# Patient Record
Sex: Male | Born: 1955 | Race: White | Hispanic: No | Smoking: Never smoker
Health system: Southern US, Community
[De-identification: ages and names within clinical notes are randomized; demographics above are authoritative.]

## PROBLEM LIST (undated history)

## (undated) DIAGNOSIS — F329 Major depressive disorder, single episode, unspecified: Secondary | ICD-10-CM

## (undated) DIAGNOSIS — C9000 Multiple myeloma not having achieved remission: Secondary | ICD-10-CM

## (undated) DIAGNOSIS — D61818 Other pancytopenia: Secondary | ICD-10-CM

## (undated) DIAGNOSIS — K5790 Diverticulosis of intestine, part unspecified, without perforation or abscess without bleeding: Secondary | ICD-10-CM

## (undated) DIAGNOSIS — D509 Iron deficiency anemia, unspecified: Secondary | ICD-10-CM

## (undated) DIAGNOSIS — I420 Dilated cardiomyopathy: Secondary | ICD-10-CM

## (undated) DIAGNOSIS — I10 Essential (primary) hypertension: Secondary | ICD-10-CM

## (undated) DIAGNOSIS — F32A Depression, unspecified: Secondary | ICD-10-CM

## (undated) HISTORY — PX: INGUINAL HERNIA REPAIR: SUR1180

---

## 2009-10-21 ENCOUNTER — Other Ambulatory Visit: Admission: RE | Admit: 2009-10-21 | Discharge: 2009-10-21 | Payer: Self-pay | Admitting: Oncology

## 2014-07-29 ENCOUNTER — Other Ambulatory Visit (HOSPITAL_COMMUNITY)
Admission: RE | Admit: 2014-07-29 | Discharge: 2014-07-29 | Disposition: A | Discharge status: Home / Self Care | Payer: 59 | Source: Ambulatory Visit | Attending: Oncology | Admitting: Oncology

## 2014-07-29 DIAGNOSIS — D649 Anemia, unspecified: Secondary | ICD-10-CM | POA: Diagnosis not present

## 2014-07-29 LAB — BONE MARROW EXAM

## 2014-08-06 LAB — CHROMOSOME ANALYSIS, BONE MARROW

## 2014-08-06 LAB — TISSUE HYBRIDIZATION (BONE MARROW)-NCBH

## 2014-08-18 ENCOUNTER — Encounter (HOSPITAL_COMMUNITY): Payer: Self-pay

## 2016-08-01 DIAGNOSIS — E872 Acidosis: Secondary | ICD-10-CM

## 2016-08-01 DIAGNOSIS — E876 Hypokalemia: Secondary | ICD-10-CM

## 2016-08-01 DIAGNOSIS — C9000 Multiple myeloma not having achieved remission: Secondary | ICD-10-CM

## 2016-08-01 DIAGNOSIS — K625 Hemorrhage of anus and rectum: Secondary | ICD-10-CM

## 2016-08-01 DIAGNOSIS — D509 Iron deficiency anemia, unspecified: Secondary | ICD-10-CM | POA: Diagnosis not present

## 2016-08-01 DIAGNOSIS — D649 Anemia, unspecified: Secondary | ICD-10-CM | POA: Diagnosis not present

## 2016-08-02 DIAGNOSIS — D649 Anemia, unspecified: Secondary | ICD-10-CM | POA: Diagnosis not present

## 2016-08-02 DIAGNOSIS — D509 Iron deficiency anemia, unspecified: Secondary | ICD-10-CM | POA: Diagnosis not present

## 2016-08-02 DIAGNOSIS — E872 Acidosis: Secondary | ICD-10-CM | POA: Diagnosis not present

## 2016-08-02 DIAGNOSIS — K625 Hemorrhage of anus and rectum: Secondary | ICD-10-CM | POA: Diagnosis not present

## 2016-08-03 DIAGNOSIS — E872 Acidosis: Secondary | ICD-10-CM | POA: Diagnosis not present

## 2016-08-03 DIAGNOSIS — D509 Iron deficiency anemia, unspecified: Secondary | ICD-10-CM | POA: Diagnosis not present

## 2016-08-03 DIAGNOSIS — K625 Hemorrhage of anus and rectum: Secondary | ICD-10-CM | POA: Diagnosis not present

## 2016-08-03 DIAGNOSIS — D649 Anemia, unspecified: Secondary | ICD-10-CM | POA: Diagnosis not present

## 2016-08-04 DIAGNOSIS — D509 Iron deficiency anemia, unspecified: Secondary | ICD-10-CM | POA: Diagnosis not present

## 2016-08-04 DIAGNOSIS — D649 Anemia, unspecified: Secondary | ICD-10-CM | POA: Diagnosis not present

## 2016-08-04 DIAGNOSIS — E872 Acidosis: Secondary | ICD-10-CM | POA: Diagnosis not present

## 2016-08-04 DIAGNOSIS — K625 Hemorrhage of anus and rectum: Secondary | ICD-10-CM | POA: Diagnosis not present

## 2016-08-27 DIAGNOSIS — D72819 Decreased white blood cell count, unspecified: Secondary | ICD-10-CM

## 2016-08-27 DIAGNOSIS — D696 Thrombocytopenia, unspecified: Secondary | ICD-10-CM

## 2016-08-27 DIAGNOSIS — R791 Abnormal coagulation profile: Secondary | ICD-10-CM

## 2016-08-27 DIAGNOSIS — R601 Generalized edema: Secondary | ICD-10-CM

## 2016-08-27 DIAGNOSIS — C9000 Multiple myeloma not having achieved remission: Secondary | ICD-10-CM

## 2016-08-27 DIAGNOSIS — R779 Abnormality of plasma protein, unspecified: Secondary | ICD-10-CM

## 2016-08-27 DIAGNOSIS — D62 Acute posthemorrhagic anemia: Secondary | ICD-10-CM | POA: Diagnosis not present

## 2016-08-27 DIAGNOSIS — K625 Hemorrhage of anus and rectum: Secondary | ICD-10-CM

## 2016-08-28 DIAGNOSIS — C9000 Multiple myeloma not having achieved remission: Secondary | ICD-10-CM | POA: Diagnosis not present

## 2016-08-28 DIAGNOSIS — K625 Hemorrhage of anus and rectum: Secondary | ICD-10-CM | POA: Diagnosis not present

## 2016-08-28 DIAGNOSIS — D62 Acute posthemorrhagic anemia: Secondary | ICD-10-CM | POA: Diagnosis not present

## 2016-08-29 DIAGNOSIS — D62 Acute posthemorrhagic anemia: Secondary | ICD-10-CM | POA: Diagnosis not present

## 2016-08-29 DIAGNOSIS — C9000 Multiple myeloma not having achieved remission: Secondary | ICD-10-CM | POA: Diagnosis not present

## 2016-08-29 DIAGNOSIS — K625 Hemorrhage of anus and rectum: Secondary | ICD-10-CM | POA: Diagnosis not present

## 2016-08-30 DIAGNOSIS — K625 Hemorrhage of anus and rectum: Secondary | ICD-10-CM | POA: Diagnosis not present

## 2016-08-30 DIAGNOSIS — C9000 Multiple myeloma not having achieved remission: Secondary | ICD-10-CM | POA: Diagnosis not present

## 2016-08-30 DIAGNOSIS — D62 Acute posthemorrhagic anemia: Secondary | ICD-10-CM | POA: Diagnosis not present

## 2016-09-05 DIAGNOSIS — C9000 Multiple myeloma not having achieved remission: Secondary | ICD-10-CM | POA: Diagnosis not present

## 2016-10-04 DIAGNOSIS — N179 Acute kidney failure, unspecified: Secondary | ICD-10-CM

## 2016-10-04 DIAGNOSIS — D649 Anemia, unspecified: Secondary | ICD-10-CM

## 2016-10-04 DIAGNOSIS — C9000 Multiple myeloma not having achieved remission: Secondary | ICD-10-CM

## 2016-10-04 DIAGNOSIS — R0902 Hypoxemia: Secondary | ICD-10-CM | POA: Diagnosis not present

## 2016-10-04 DIAGNOSIS — I509 Heart failure, unspecified: Secondary | ICD-10-CM

## 2016-10-05 DIAGNOSIS — C9000 Multiple myeloma not having achieved remission: Secondary | ICD-10-CM

## 2016-10-05 DIAGNOSIS — D696 Thrombocytopenia, unspecified: Secondary | ICD-10-CM | POA: Diagnosis not present

## 2016-10-05 DIAGNOSIS — I5021 Acute systolic (congestive) heart failure: Secondary | ICD-10-CM

## 2016-10-05 DIAGNOSIS — N179 Acute kidney failure, unspecified: Secondary | ICD-10-CM | POA: Diagnosis not present

## 2016-10-06 ENCOUNTER — Inpatient Hospital Stay (HOSPITAL_COMMUNITY)
Admission: AD | Admit: 2016-10-06 | Discharge: 2016-10-20 | DRG: 291 | Disposition: A | Discharge status: Home / Self Care | Payer: BLUE CROSS/BLUE SHIELD | Source: Other Acute Inpatient Hospital | Attending: Internal Medicine | Admitting: Internal Medicine

## 2016-10-06 ENCOUNTER — Encounter (HOSPITAL_COMMUNITY): Payer: Self-pay | Admitting: Internal Medicine

## 2016-10-06 ENCOUNTER — Inpatient Hospital Stay (HOSPITAL_COMMUNITY): Payer: BLUE CROSS/BLUE SHIELD

## 2016-10-06 DIAGNOSIS — D696 Thrombocytopenia, unspecified: Secondary | ICD-10-CM

## 2016-10-06 DIAGNOSIS — E854 Organ-limited amyloidosis: Secondary | ICD-10-CM

## 2016-10-06 DIAGNOSIS — I5043 Acute on chronic combined systolic (congestive) and diastolic (congestive) heart failure: Secondary | ICD-10-CM | POA: Diagnosis present

## 2016-10-06 DIAGNOSIS — N170 Acute kidney failure with tubular necrosis: Secondary | ICD-10-CM | POA: Diagnosis not present

## 2016-10-06 DIAGNOSIS — I43 Cardiomyopathy in diseases classified elsewhere: Secondary | ICD-10-CM | POA: Diagnosis present

## 2016-10-06 DIAGNOSIS — Z888 Allergy status to other drugs, medicaments and biological substances status: Secondary | ICD-10-CM

## 2016-10-06 DIAGNOSIS — Z6841 Body Mass Index (BMI) 40.0 and over, adult: Secondary | ICD-10-CM | POA: Diagnosis not present

## 2016-10-06 DIAGNOSIS — I420 Dilated cardiomyopathy: Secondary | ICD-10-CM | POA: Diagnosis present

## 2016-10-06 DIAGNOSIS — E873 Alkalosis: Secondary | ICD-10-CM | POA: Diagnosis not present

## 2016-10-06 DIAGNOSIS — K5900 Constipation, unspecified: Secondary | ICD-10-CM | POA: Diagnosis not present

## 2016-10-06 DIAGNOSIS — R079 Chest pain, unspecified: Secondary | ICD-10-CM

## 2016-10-06 DIAGNOSIS — Z8249 Family history of ischemic heart disease and other diseases of the circulatory system: Secondary | ICD-10-CM

## 2016-10-06 DIAGNOSIS — D689 Coagulation defect, unspecified: Secondary | ICD-10-CM | POA: Diagnosis present

## 2016-10-06 DIAGNOSIS — Z91048 Other nonmedicinal substance allergy status: Secondary | ICD-10-CM

## 2016-10-06 DIAGNOSIS — D61810 Antineoplastic chemotherapy induced pancytopenia: Secondary | ICD-10-CM | POA: Diagnosis present

## 2016-10-06 DIAGNOSIS — D892 Hypergammaglobulinemia, unspecified: Secondary | ICD-10-CM | POA: Diagnosis present

## 2016-10-06 DIAGNOSIS — R0902 Hypoxemia: Secondary | ICD-10-CM

## 2016-10-06 DIAGNOSIS — E538 Deficiency of other specified B group vitamins: Secondary | ICD-10-CM

## 2016-10-06 DIAGNOSIS — N179 Acute kidney failure, unspecified: Secondary | ICD-10-CM | POA: Diagnosis present

## 2016-10-06 DIAGNOSIS — Z880 Allergy status to penicillin: Secondary | ICD-10-CM

## 2016-10-06 DIAGNOSIS — N183 Chronic kidney disease, stage 3 (moderate): Secondary | ICD-10-CM | POA: Diagnosis present

## 2016-10-06 DIAGNOSIS — I10 Essential (primary) hypertension: Secondary | ICD-10-CM | POA: Diagnosis not present

## 2016-10-06 DIAGNOSIS — I130 Hypertensive heart and chronic kidney disease with heart failure and stage 1 through stage 4 chronic kidney disease, or unspecified chronic kidney disease: Principal | ICD-10-CM | POA: Diagnosis present

## 2016-10-06 DIAGNOSIS — E875 Hyperkalemia: Secondary | ICD-10-CM | POA: Diagnosis present

## 2016-10-06 DIAGNOSIS — D61818 Other pancytopenia: Secondary | ICD-10-CM | POA: Diagnosis not present

## 2016-10-06 DIAGNOSIS — J069 Acute upper respiratory infection, unspecified: Secondary | ICD-10-CM

## 2016-10-06 DIAGNOSIS — D509 Iron deficiency anemia, unspecified: Secondary | ICD-10-CM | POA: Diagnosis present

## 2016-10-06 DIAGNOSIS — Z8701 Personal history of pneumonia (recurrent): Secondary | ICD-10-CM

## 2016-10-06 DIAGNOSIS — J9601 Acute respiratory failure with hypoxia: Secondary | ICD-10-CM | POA: Diagnosis present

## 2016-10-06 DIAGNOSIS — E871 Hypo-osmolality and hyponatremia: Secondary | ICD-10-CM | POA: Diagnosis present

## 2016-10-06 DIAGNOSIS — I5082 Biventricular heart failure: Secondary | ICD-10-CM | POA: Diagnosis present

## 2016-10-06 DIAGNOSIS — I509 Heart failure, unspecified: Secondary | ICD-10-CM

## 2016-10-06 DIAGNOSIS — N390 Urinary tract infection, site not specified: Secondary | ICD-10-CM | POA: Diagnosis present

## 2016-10-06 DIAGNOSIS — Z515 Encounter for palliative care: Secondary | ICD-10-CM | POA: Diagnosis not present

## 2016-10-06 DIAGNOSIS — D500 Iron deficiency anemia secondary to blood loss (chronic): Secondary | ICD-10-CM | POA: Diagnosis present

## 2016-10-06 DIAGNOSIS — D649 Anemia, unspecified: Secondary | ICD-10-CM

## 2016-10-06 DIAGNOSIS — R601 Generalized edema: Secondary | ICD-10-CM | POA: Diagnosis not present

## 2016-10-06 DIAGNOSIS — Z66 Do not resuscitate: Secondary | ICD-10-CM | POA: Diagnosis present

## 2016-10-06 DIAGNOSIS — Z7189 Other specified counseling: Secondary | ICD-10-CM

## 2016-10-06 DIAGNOSIS — D638 Anemia in other chronic diseases classified elsewhere: Secondary | ICD-10-CM | POA: Diagnosis present

## 2016-10-06 DIAGNOSIS — I5023 Acute on chronic systolic (congestive) heart failure: Secondary | ICD-10-CM | POA: Diagnosis not present

## 2016-10-06 DIAGNOSIS — C9000 Multiple myeloma not having achieved remission: Secondary | ICD-10-CM | POA: Diagnosis present

## 2016-10-06 DIAGNOSIS — R188 Other ascites: Secondary | ICD-10-CM | POA: Diagnosis not present

## 2016-10-06 DIAGNOSIS — T451X5A Adverse effect of antineoplastic and immunosuppressive drugs, initial encounter: Secondary | ICD-10-CM | POA: Diagnosis present

## 2016-10-06 DIAGNOSIS — E860 Dehydration: Secondary | ICD-10-CM

## 2016-10-06 DIAGNOSIS — M549 Dorsalgia, unspecified: Secondary | ICD-10-CM | POA: Diagnosis present

## 2016-10-06 DIAGNOSIS — D699 Hemorrhagic condition, unspecified: Secondary | ICD-10-CM

## 2016-10-06 DIAGNOSIS — Z79899 Other long term (current) drug therapy: Secondary | ICD-10-CM

## 2016-10-06 HISTORY — DX: Major depressive disorder, single episode, unspecified: F32.9

## 2016-10-06 HISTORY — DX: Other pancytopenia: D61.818

## 2016-10-06 HISTORY — DX: Depression, unspecified: F32.A

## 2016-10-06 HISTORY — DX: Essential (primary) hypertension: I10

## 2016-10-06 HISTORY — DX: Iron deficiency anemia, unspecified: D50.9

## 2016-10-06 HISTORY — DX: Dilated cardiomyopathy: I42.0

## 2016-10-06 HISTORY — DX: Multiple myeloma not having achieved remission: C90.00

## 2016-10-06 HISTORY — DX: Diverticulosis of intestine, part unspecified, without perforation or abscess without bleeding: K57.90

## 2016-10-06 LAB — CBC WITH DIFFERENTIAL/PLATELET
BASOS ABS: 0 10*3/uL (ref 0.0–0.1)
Basophils Relative: 0 %
EOS ABS: 0 10*3/uL (ref 0.0–0.7)
Eosinophils Relative: 1 %
HEMATOCRIT: 22.7 % — AB (ref 39.0–52.0)
Hemoglobin: 7.1 g/dL — ABNORMAL LOW (ref 13.0–17.0)
LYMPHS ABS: 0.7 10*3/uL (ref 0.7–4.0)
LYMPHS PCT: 18 %
MCH: 30 pg (ref 26.0–34.0)
MCHC: 31.3 g/dL (ref 30.0–36.0)
MCV: 95.8 fL (ref 78.0–100.0)
MONO ABS: 0.6 10*3/uL (ref 0.1–1.0)
Monocytes Relative: 15 %
NEUTROS ABS: 2.5 10*3/uL (ref 1.7–7.7)
Neutrophils Relative %: 66 %
PLATELETS: 69 10*3/uL — AB (ref 150–400)
RBC: 2.37 MIL/uL — ABNORMAL LOW (ref 4.22–5.81)
RDW: 21.3 % — ABNORMAL HIGH (ref 11.5–15.5)
WBC: 3.8 10*3/uL — ABNORMAL LOW (ref 4.0–10.5)

## 2016-10-06 LAB — URINALYSIS, COMPLETE (UACMP) WITH MICROSCOPIC
BILIRUBIN URINE: NEGATIVE
Glucose, UA: 50 mg/dL — AB
Ketones, ur: NEGATIVE mg/dL
LEUKOCYTES UA: NEGATIVE
NITRITE: NEGATIVE
PH: 6 (ref 5.0–8.0)
Protein, ur: 100 mg/dL — AB
SPECIFIC GRAVITY, URINE: 1.012 (ref 1.005–1.030)

## 2016-10-06 LAB — APTT: APTT: 70 s — AB (ref 24–36)

## 2016-10-06 LAB — COMPREHENSIVE METABOLIC PANEL
ALBUMIN: 1.4 g/dL — AB (ref 3.5–5.0)
ALT: 14 U/L — ABNORMAL LOW (ref 17–63)
AST: 19 U/L (ref 15–41)
Alkaline Phosphatase: 85 U/L (ref 38–126)
Anion gap: 13 (ref 5–15)
BUN: 34 mg/dL — AB (ref 6–20)
CHLORIDE: 91 mmol/L — AB (ref 101–111)
CO2: 23 mmol/L (ref 22–32)
Calcium: 7.7 mg/dL — ABNORMAL LOW (ref 8.9–10.3)
Creatinine, Ser: 3.28 mg/dL — ABNORMAL HIGH (ref 0.61–1.24)
GFR calc Af Amer: 22 mL/min — ABNORMAL LOW (ref >60)
GFR calc non Af Amer: 19 mL/min — ABNORMAL LOW (ref >60)
GLUCOSE: 116 mg/dL — AB (ref 65–99)
POTASSIUM: 5.8 mmol/L — AB (ref 3.5–5.1)
SODIUM: 127 mmol/L — AB (ref 135–145)
Total Bilirubin: 1.2 mg/dL (ref 0.3–1.2)
Total Protein: 11.9 g/dL — ABNORMAL HIGH (ref 6.5–8.1)

## 2016-10-06 LAB — PHOSPHORUS: Phosphorus: 4.9 mg/dL — ABNORMAL HIGH (ref 2.5–4.6)

## 2016-10-06 LAB — PROTIME-INR
INR: 1.97
Prothrombin Time: 22.7 seconds — ABNORMAL HIGH (ref 11.4–15.2)

## 2016-10-06 LAB — BRAIN NATRIURETIC PEPTIDE: B NATRIURETIC PEPTIDE 5: 1535.9 pg/mL — AB (ref 0.0–100.0)

## 2016-10-06 LAB — MAGNESIUM: Magnesium: 2.2 mg/dL (ref 1.7–2.4)

## 2016-10-06 LAB — GLUCOSE, CAPILLARY: Glucose-Capillary: 97 mg/dL (ref 65–99)

## 2016-10-06 LAB — TROPONIN I: Troponin I: <0.03 ng/mL (ref <0.03)

## 2016-10-06 MED ORDER — IPRATROPIUM-ALBUTEROL 0.5-2.5 (3) MG/3ML IN SOLN: 3.0000 mL | RESPIRATORY_TRACT | Status: DC | PRN

## 2016-10-06 MED ORDER — ONDANSETRON HCL 4 MG PO TABS
4.0000 mg | ORAL_TABLET | Freq: Four times a day (QID) | ORAL | Status: DC | PRN
  Administered 2016-10-13: 4 mg via ORAL
  Filled 2016-10-06: qty 1

## 2016-10-06 MED ORDER — POTASSIUM CHLORIDE CRYS ER 10 MEQ PO TBCR
10.0000 meq | EXTENDED_RELEASE_TABLET | Freq: Two times a day (BID) | ORAL | Status: DC
  Administered 2016-10-06: 10 meq via ORAL
  Filled 2016-10-06: qty 1

## 2016-10-06 MED ORDER — SODIUM CHLORIDE 0.9% FLUSH
3.0000 mL | Freq: Two times a day (BID) | INTRAVENOUS | Status: DC
  Administered 2016-10-06 – 2016-10-09 (×5): 3 mL via INTRAVENOUS

## 2016-10-06 MED ORDER — ONDANSETRON HCL 4 MG/2ML IJ SOLN
4.0000 mg | Freq: Four times a day (QID) | INTRAMUSCULAR | Status: DC | PRN
  Administered 2016-10-14 – 2016-10-19 (×3): 4 mg via INTRAVENOUS
  Filled 2016-10-06 (×3): qty 2

## 2016-10-06 MED ORDER — FUROSEMIDE 10 MG/ML IJ SOLN
160.0000 mg | Freq: Three times a day (TID) | INTRAVENOUS | Status: DC
  Administered 2016-10-06: 160 mg via INTRAVENOUS
  Filled 2016-10-06 (×2): qty 16

## 2016-10-06 MED ORDER — ACETAMINOPHEN 325 MG PO TABS
650.0000 mg | ORAL_TABLET | Freq: Four times a day (QID) | ORAL | Status: DC | PRN
  Administered 2016-10-07 – 2016-10-16 (×9): 650 mg via ORAL
  Filled 2016-10-06 (×12): qty 2

## 2016-10-06 MED ORDER — ACETAMINOPHEN 650 MG RE SUPP: 650.0000 mg | Freq: Four times a day (QID) | RECTAL | Status: DC | PRN

## 2016-10-06 NOTE — H&P (Signed)
History and Physical    Lance [name] DGL:[id number] DOB: Jan 26, 1956 DOA: 10/06/2016  PCP: Physicians are in [place].  Patient coming from: [facility]  Chief Complaint: Progressive renal insufficiency while diuresing for CHF.  HPI: Lance [name] is a 61 y.o. gentleman with a history of transfusion dependent multiple myeloma, systolic heart failure with EF 35-40% in January 2018, pancytopenia, and HTN who was admitted to [facility] on 10/04/2016 for management of CHF exacerbation.  The patient has required several transfusions in the past 30 days.  He presented with shortness of breath and anasarca.  His home dose of lasix has been 81m daily (he tells me that his dose was going to be increased to 858mpo BID due to his swelling, but that had not happened [place]  He was treated with IV lasix 4073mID.  He put out 1.5L of fluid, but his creatinine has been rising.  Today, BUN 30, creatinine 2.9, potassium 6.1, normal bicarb.  His baseline creatinine is 1.  He received kayexalate for the elevated potassium level prior to transfer.  Transfer requested for nephrology consultation.  [place] [place] [address].  He had a CT of the abdomen and pelvis without contrast that was negative for hydronephrosis and kidney stones..  Moderate ascites noted.  Chest xray showed cardiomegaly, mild CHF, and small bilateral effusions.  Lower extremity dopplers were negative for DVT.  At time of admission here, he reports intermittent back pain that radiates anteriorly to his midchest.  He has had some light-headeness, but no LOC.  He also reports difficulty urinating due to scrotal swelling.  Review of Systems: As per HPI otherwise 10 point review of systems negative.    Past Medical History:  Diagnosis Date  . Depression   . Diverticulosis   . Hypertension   . [place] [facility]   Past Surgical History:  Procedure Laterality  Date  . INGUINAL HERNIA REPAIR Right      reports that he has never smoked. He has never used smokeless tobacco. He reports that he does not drink alcohol or use drugs.  Allergies  Allergen Reactions  . Penicillins     Unknown Has patient had a PCN reaction causing immediate rash, facial/tongue/throat swelling, SOB or lightheadedness with hypotension: YES Has patient had a PCN reaction causing severe rash involving mucus membranes or skin necrosis: NO Has patient had a PCN reaction that required hospitalization NO Has patient had a PCN reaction occurring within the last 10 years: NO If all of the above answers are "NO", then may proceed with Cephalosporin use.  . Tobacco [Nicotiana Tabacum]     Family History  Problem Relation Age of Onset  . Hypertension Mother   . Diabetes Mother   . Bone cancer Mother   . Diabetes Father   . Prostate cancer Father   . Lung cancer Sister   . Brain cancer Brother      Prior to Admission medications   Medication Sig Start Date End Date Taking? Authorizing Provider  B Complex-C-Folic Acid (RENO CAPS) 1 MG CAPS Take 1 capsule by mouth daily. 08/11/16  Yes Historical Provider, MD  ferrous gluconate (FERGON) 324 MG tablet Take 1 tablet by mouth daily. 08/10/16  Yes Historical Provider, MD  furosemide (LASIX) 40 MG tablet Take 80 mg by mouth daily. 09/29/16  Yes Historical Provider, MD  lisinopril (PRINIVIL,ZESTRIL) 2.5 MG tablet Take 2.5 mg by mouth daily.  09/26/16 10/26/16 Yes Historical Provider, MD  potassium chloride (K-DUR,KLOR-CON) 10 MEQ tablet Take 1 tablet by mouth 2 (two) times daily. 09/26/16 09/26/17 Yes Historical Provider, MD  PROAIR HFA 108 (90 Base) MCG/ACT inhaler Take 1 puff by mouth daily as needed. 07/19/16  Yes Historical Provider, MD    Physical Exam: Vitals:   10/06/16 2014  BP: 120/67  Pulse: 84  Resp: (!) 24  Temp: 97.8 F (36.6 C)  TempSrc: Oral  SpO2: 99%  Weight: (!) 143.6 kg (316 lb 9.3 oz)      Constitutional:  NAD, appears short of breath, speaking in 4-5 word sentences Vitals:   10/06/16 2014  BP: 120/67  Pulse: 84  Resp: (!) 24  Temp: 97.8 F (36.6 C)  TempSrc: Oral  SpO2: 99%  Weight: (!) 143.6 kg (316 lb 9.3 oz)   Eyes: PERRL, lids and conjunctivae normal ENMT: Mucous membranes are moist. Posterior pharynx clear of any exudate or lesions. Normal dentition.  Neck: normal appearance, supple, no masses Respiratory: Diminished bilaterally but no wheezing.  Mildly tachypneic.  No accessory muscle use.  Cardiovascular: Normal rate, regular rhythm, no murmurs / rubs / gallops. 2+ pitting edema in bilateral lower extremities.  2+ pedal pulses. GI: abdomen is protuberant and distended, mildly compressible but no rigid.  No guarding.  No tenderness.  No rebound.   Bowel sounds are hypoactive. Musculoskeletal:  No joint deformity in upper and lower extremities. Good ROM, no contractures. Normal muscle tone.  Skin: pale, cool, dry.  Rashes to both shins, petechial with stasis changes as well. Neurologic: CN 2-12 grossly intact. Sensation intact, Strength symmetric bilaterally. Psychiatric: Normal judgment and insight. Alert and oriented x 3. Normal mood.     Labs on Admission: I have personally reviewed following labs and imaging studies  CBC:  Recent Labs Lab 10/06/16 2101  WBC 3.8*  NEUTROABS PENDING  HGB 7.1*  HCT 22.7*  MCV 95.8  PLT PENDING   Basic Metabolic Panel:  Recent Labs Lab 10/06/16 2101  NA 127*  K 5.8*  CL 91*  CO2 23  GLUCOSE 116*  BUN 34*  CREATININE 3.28*  CALCIUM 7.7*  MG 2.2  PHOS 4.9*   GFR: CrCl cannot be calculated (Unknown ideal weight.). Liver Function Tests:  Recent Labs Lab 10/06/16 2101  AST 19  ALT 14*  ALKPHOS 85  BILITOT 1.2  PROT 11.9*  ALBUMIN 1.4*   Coagulation Profile:  Recent Labs Lab 10/06/16 2101  INR 1.97   Cardiac Enzymes:  Recent Labs Lab 10/06/16 2101  TROPONINI <0.03   CBG:  Recent Labs Lab  10/06/16 2012  GLUCAP 97   Urine analysis:    Component Value Date/Time   COLORURINE RED (A) 10/06/2016 2210   APPEARANCEUR CLOUDY (A) 10/06/2016 2210   LABSPEC 1.012 10/06/2016 2210   PHURINE 6.0 10/06/2016 2210   GLUCOSEU 50 (A) 10/06/2016 2210   HGBUR MODERATE (A) 10/06/2016 2210   BILIRUBINUR NEGATIVE 10/06/2016 2210   KETONESUR NEGATIVE 10/06/2016 2210   PROTEINUR 100 (A) 10/06/2016 2210   NITRITE NEGATIVE 10/06/2016 2210   LEUKOCYTESUR NEGATIVE 10/06/2016 2210   Radiological Exams on Admission: Portable Chest 1 View  Result Date: 10/06/2016 CLINICAL DATA:  Chest pain, history of pneumonia and wheezing. EXAM: PORTABLE CHEST 1 VIEW COMPARISON:  10/04/2016 FINDINGS: There is stable cardiomegaly. No aortic aneurysm. Mild vascular congestion consistent with CHF. Small bilateral pleural effusions with trace fluid in the right minor fissure. Subsegmental atelectasis in the left mid lung versus scarring. Bibasilar  densities cannot exclude superimposed pneumonia. No acute osseous abnormality. IMPRESSION: Stable cardiomegaly with mild CHF. Superimposed bibasilar pneumonia would be difficult to entirely exclude. Trace bilateral pleural effusions. Electronically Signed   By: Ashley Royalty M.D.   On: 10/06/2016 22:09    EKG: Pending.  Assessment/Plan Principal Problem:   CHF (congestive heart failure) (HCC) Active Problems:   AKI (acute kidney injury) ([place])   HTN (hypertension)   Anasarca      Acute exacerbation of chronic systolic heart failure with AKI in the setting of diuresis. --Case discussed with Dr. Florene Glen of nephrology, who will see the patient in consultation --Lasix 165m IV q8h initially ordered per discussion with nephrology.  The patient has received one dose.  However, admission labs here are worsening compared to labs [place] [facility] [place], so I held additional doses until AM labs can be reviewed. --Check Urinalysis --Strict I/O --Daily weights --1200 fluid  restriction  Atypical chest pain; I suspect this is related to volume overload. --Telemetry monitoring --EKG pending --Serial troponin  History of HTN --ACE-I held due to worsening renal function --Monitor BP  Hyperkalemia, persistent [place]--Will give another dose of kayexalate 30g po once --IV insulin and one amp of D50 now --AM labs pending  Elevated phos level, hypocalcemia --Will start calcium acetate (phoslo) 6671mTID with meals    DVT prophylaxis: SCDs Code Status: FULL Family Communication: Patient alone at time of admission. Disposition Plan: Expect he will go home at discharge. Consults called: Nephrology (Dr. PoFlorene GlenAdmission status: Inpatient telemetry   TIME SPENT: 70 minutes   CaEber JonesD [facility] Pager [phone] 7PM-7AM, please contact night-coverage [email or web address] Password [email or web address]  10/06/2016, 10:05 PM

## 2016-10-07 ENCOUNTER — Inpatient Hospital Stay (HOSPITAL_COMMUNITY): Payer: BLUE CROSS/BLUE SHIELD

## 2016-10-07 DIAGNOSIS — J9601 Acute respiratory failure with hypoxia: Secondary | ICD-10-CM | POA: Diagnosis present

## 2016-10-07 DIAGNOSIS — R079 Chest pain, unspecified: Secondary | ICD-10-CM

## 2016-10-07 DIAGNOSIS — T451X5A Adverse effect of antineoplastic and immunosuppressive drugs, initial encounter: Secondary | ICD-10-CM

## 2016-10-07 DIAGNOSIS — I509 Heart failure, unspecified: Secondary | ICD-10-CM

## 2016-10-07 DIAGNOSIS — C9000 Multiple myeloma not having achieved remission: Secondary | ICD-10-CM | POA: Diagnosis present

## 2016-10-07 DIAGNOSIS — I10 Essential (primary) hypertension: Secondary | ICD-10-CM

## 2016-10-07 DIAGNOSIS — D61810 Antineoplastic chemotherapy induced pancytopenia: Secondary | ICD-10-CM | POA: Diagnosis present

## 2016-10-07 DIAGNOSIS — E875 Hyperkalemia: Secondary | ICD-10-CM | POA: Diagnosis present

## 2016-10-07 LAB — CBC WITH DIFFERENTIAL/PLATELET
BASOS ABS: 0 10*3/uL (ref 0.0–0.1)
Basophils Relative: 0 %
EOS PCT: 1 %
Eosinophils Absolute: 0 10*3/uL (ref 0.0–0.7)
HCT: 23.4 % — ABNORMAL LOW (ref 39.0–52.0)
Hemoglobin: 7.5 g/dL — ABNORMAL LOW (ref 13.0–17.0)
Lymphocytes Relative: 19 %
Lymphs Abs: 0.8 10*3/uL (ref 0.7–4.0)
MCH: 30.5 pg (ref 26.0–34.0)
MCHC: 32.1 g/dL (ref 30.0–36.0)
MCV: 95.1 fL (ref 78.0–100.0)
MONO ABS: 0.3 10*3/uL (ref 0.1–1.0)
Monocytes Relative: 8 %
NEUTROS PCT: 72 %
Neutro Abs: 2.9 10*3/uL (ref 1.7–7.7)
PLATELETS: 71 10*3/uL — AB (ref 150–400)
RBC: 2.46 MIL/uL — AB (ref 4.22–5.81)
RDW: 21.3 % — ABNORMAL HIGH (ref 11.5–15.5)
WBC: 4 10*3/uL (ref 4.0–10.5)

## 2016-10-07 LAB — MAGNESIUM: Magnesium: 2.3 mg/dL (ref 1.7–2.4)

## 2016-10-07 LAB — RENAL FUNCTION PANEL
Albumin: 1.4 g/dL — ABNORMAL LOW (ref 3.5–5.0)
Anion gap: 10 (ref 5–15)
BUN: 36 mg/dL — ABNORMAL HIGH (ref 6–20)
CHLORIDE: 96 mmol/L — AB (ref 101–111)
CO2: 23 mmol/L (ref 22–32)
CREATININE: 3.28 mg/dL — AB (ref 0.61–1.24)
Calcium: 7.4 mg/dL — ABNORMAL LOW (ref 8.9–10.3)
GFR calc Af Amer: 22 mL/min — ABNORMAL LOW (ref >60)
GFR, EST NON AFRICAN AMERICAN: 19 mL/min — AB (ref >60)
Glucose, Bld: 124 mg/dL — ABNORMAL HIGH (ref 65–99)
Phosphorus: 4.9 mg/dL — ABNORMAL HIGH (ref 2.5–4.6)
Potassium: 5.2 mmol/L — ABNORMAL HIGH (ref 3.5–5.1)
Sodium: 129 mmol/L — ABNORMAL LOW (ref 135–145)

## 2016-10-07 LAB — TROPONIN I

## 2016-10-07 LAB — ECHOCARDIOGRAM COMPLETE
Height: 70 in
Weight: 5065.29 oz

## 2016-10-07 LAB — GLUCOSE, CAPILLARY: Glucose-Capillary: 83 mg/dL (ref 65–99)

## 2016-10-07 MED ORDER — CALCIUM ACETATE (PHOS BINDER) 667 MG PO CAPS
667.0000 mg | ORAL_CAPSULE | Freq: Three times a day (TID) | ORAL | Status: DC
  Administered 2016-10-07 – 2016-10-20 (×37): 667 mg via ORAL
  Filled 2016-10-07 (×38): qty 1

## 2016-10-07 MED ORDER — NEPRO/CARBSTEADY PO LIQD
237.0000 mL | Freq: Three times a day (TID) | ORAL | Status: DC | PRN
  Filled 2016-10-07: qty 237

## 2016-10-07 MED ORDER — FUROSEMIDE 10 MG/ML IJ SOLN
160.0000 mg | Freq: Three times a day (TID) | INTRAVENOUS | Status: DC
  Administered 2016-10-07 (×2): 160 mg via INTRAVENOUS
  Filled 2016-10-07 (×4): qty 16

## 2016-10-07 MED ORDER — CALCIUM CARBONATE ANTACID 1250 MG/5ML PO SUSP
500.0000 mg | Freq: Four times a day (QID) | ORAL | Status: DC | PRN
  Administered 2016-10-12: 500 mg via ORAL
  Filled 2016-10-07 (×4): qty 5

## 2016-10-07 MED ORDER — METOLAZONE 5 MG PO TABS
10.0000 mg | ORAL_TABLET | Freq: Two times a day (BID) | ORAL | Status: DC
  Administered 2016-10-08 – 2016-10-14 (×13): 10 mg via ORAL
  Filled 2016-10-07: qty 1
  Filled 2016-10-07: qty 2
  Filled 2016-10-07 (×2): qty 1
  Filled 2016-10-07 (×9): qty 2
  Filled 2016-10-07: qty 1

## 2016-10-07 MED ORDER — HYDROXYZINE HCL 25 MG PO TABS: 25.0000 mg | ORAL_TABLET | Freq: Three times a day (TID) | ORAL | Status: DC | PRN

## 2016-10-07 MED ORDER — WHITE PETROLATUM GEL
Status: AC
  Administered 2016-10-07: 02:00:00
  Filled 2016-10-07: qty 1

## 2016-10-07 MED ORDER — PERFLUTREN LIPID MICROSPHERE
1.0000 mL | INTRAVENOUS | Status: AC | PRN
  Administered 2016-10-07: 2 mL via INTRAVENOUS
  Filled 2016-10-07: qty 10

## 2016-10-07 MED ORDER — DOCUSATE SODIUM 283 MG RE ENEM
1.0000 | ENEMA | RECTAL | Status: DC | PRN
  Filled 2016-10-07: qty 1

## 2016-10-07 MED ORDER — DEXTROSE 50 % IV SOLN
1.0000 | Freq: Once | INTRAVENOUS | Status: AC
  Administered 2016-10-07: 50 mL via INTRAVENOUS
  Filled 2016-10-07: qty 50

## 2016-10-07 MED ORDER — SORBITOL 70 % SOLN
30.0000 mL | Status: DC | PRN
  Administered 2016-10-15 (×2): 30 mL via ORAL
  Filled 2016-10-07 (×2): qty 30

## 2016-10-07 MED ORDER — FUROSEMIDE 10 MG/ML IJ SOLN
160.0000 mg | Freq: Four times a day (QID) | INTRAVENOUS | Status: DC
  Administered 2016-10-07 – 2016-10-16 (×33): 160 mg via INTRAVENOUS
  Filled 2016-10-07 (×38): qty 16

## 2016-10-07 MED ORDER — ZOLPIDEM TARTRATE 5 MG PO TABS: 5.0000 mg | ORAL_TABLET | Freq: Every evening | ORAL | Status: DC | PRN

## 2016-10-07 MED ORDER — INSULIN ASPART 100 UNIT/ML IV SOLN
10.0000 [IU] | Freq: Once | INTRAVENOUS | Status: AC
  Administered 2016-10-07: 10 [IU] via INTRAVENOUS

## 2016-10-07 MED ORDER — CAMPHOR-MENTHOL 0.5-0.5 % EX LOTN
1.0000 "application " | TOPICAL_LOTION | Freq: Three times a day (TID) | CUTANEOUS | Status: DC | PRN
  Filled 2016-10-07: qty 222

## 2016-10-07 MED ORDER — ORAL CARE MOUTH RINSE
15.0000 mL | Freq: Two times a day (BID) | OROMUCOSAL | Status: DC
  Administered 2016-10-07 – 2016-10-19 (×17): 15 mL via OROMUCOSAL

## 2016-10-07 MED ORDER — SODIUM POLYSTYRENE SULFONATE 15 GM/60ML PO SUSP
30.0000 g | Freq: Once | ORAL | Status: AC
  Administered 2016-10-07: 30 g via ORAL
  Filled 2016-10-07: qty 120

## 2016-10-07 MED ORDER — METOLAZONE 5 MG PO TABS
5.0000 mg | ORAL_TABLET | Freq: Every day | ORAL | Status: DC
  Administered 2016-10-07: 5 mg via ORAL
  Filled 2016-10-07: qty 1

## 2016-10-07 NOTE — Progress Notes (Signed)
  Echocardiogram 2D Echocardiogram has been performed.  Jennette Dubin 10/07/2016, 12:32 PM

## 2016-10-07 NOTE — Progress Notes (Signed)
New Admission Note:  Arrival Method: EMS Stretcher Mental Orientation: Alert and oriented x 4 Telemetry: Box 01 NSR  Assessment: Completed Skin: Warm and dry  IV: NSL  Pain: 4/10 Tubes: Foley  Safety Measures: Safety Fall Prevention Plan was given, discussed and signed. Admission: Completed [address] [place] Orientation: Patient has been orientated to the room, unit and the staff. Family: None   Orders have been reviewed and implemented. Will continue to monitor the patient. Call light has been placed within reach and bed alarm has been activated.   Sima Matas BSN, RN  Phone Number: [phone]

## 2016-10-07 NOTE — Progress Notes (Addendum)
PROGRESS NOTE    Lance [name]  XHB:[id number] DOB: [birth year] DOA: 10/06/2016 PCP: No primary care provider on file.    Brief Narrative:  Lance [name] is a 61 y.o. gentleman with a history of transfusion dependent multiple myeloma, systolic heart failure with EF 35-40% in January 2018, pancytopenia, and HTN who was admitted to [facility] on 10/04/2016 for management of CHF exacerbation.  The patient has required several transfusions in the past 30 days.  He presented with shortness of breath and anasarca.  His home dose of lasix has been 20m daily (he tells me that his dose was going to be increased to 888mpo BID due to his swelling, but that had not happened [place] to [place]  He was treated with IV lasix 4026mID.  He put out 1.5L of fluid, but his creatinine has been rising.  Today, BUN 30, creatinine 2.9, potassium 6.1, normal bicarb.  His baseline creatinine is 1.  He received kayexalate for the elevated potassium level prior to transfer.  Transfer requested for nephrology consultation.  [place] [facility] [place] [facility].  He had a CT of the abdomen and pelvis without contrast that was negative for hydronephrosis and kidney stones..  Moderate ascites noted.  Chest xray showed cardiomegaly, mild CHF, and small bilateral effusions.  Lower extremity dopplers were negative for DVT.  At time of admission here, he reports intermittent back pain that radiates anteriorly to his midchest.  He has had some light-headeness, but no LOC.  He also reports difficulty urinating due to scrotal swelling.    Assessment & Plan:   Principal Problem:   Acute respiratory failure with hypoxia (HCC) Active Problems:   Acute on chronic systolic CHF (congestive heart failure) (HCC)   ARF (acute renal failure) (HCC)   HTN (hypertension)   Anasarca   Hyperkalemia   Hyperphosphatemia   Hypocalcemia   Anemia   Multiple myeloma (HCC)   Chest pain   #1 acute respiratory  failure with hypoxia secondary to acute on chronic systolic heart failure/cardiomyopathy Secondary to acute on chronic systolic heart failure. Patient presented from outside hospital after presentation with heart failure and worsening renal function. Patient current weight on admission is 143.6 kg. Patient's weight on 09/26/2016 was 132 kg at his cardiologist's office. Cardiac enzymes negative 3. BNP elevated at 1535.9. It is noted per patient's cardiologist note that patient did have an EF of 35-40% which was a recently new discovery. Repeat 2-D echo pending. Patient with a urine output of 500 mL since admission. Continue Lasix 160 mg IV every 8 hours. Will add metalazone 5 mg daily. Nephrology consultation pending. If patient with no significant diuresis and worsening renal function may need temporary hemodialysis. Strict I's and O's. Daily weights.  #2 acute renal failure Questionable etiology. Likely secondary to acute systolic heart failure in the setting of ACE inhibitor. Per care everywhere patient's creatinine was 1.16 on 09/26/2016 and then subsequently 1.87 on 10/02/2016. Patient recently started on lisinopril on 1/30/ 2018 per his cardiologist. Urinalysis on admission with 100 protein, nitrite negative, leukocytes negative, glucose of 50. Will check a fractional excretion of sodium. Check a renal ultrasound. Patient on high-dose Lasix 160 mg IV every 8 hours. Strict I's and O's. Daily weights. If worsening renal function and poor urine output with no significant diuresis may likely need hemodialysis. Nephrology consultation pending.  #3 multiple myeloma Patient being followed by oncology in the outpatient setting and has recently required transfusions secondary to severe  anemia. Patient noted to have a history of iron deficiency anemia. Follow H&H. Transfusion threshold hemoglobin less than 7.  #4 anemia Likely secondary to problem #3. Check an anemia panel. Follow H&H. Chest seizure threshold  hemoglobin less than 7.  #5 anasarca See problem #1.  #6 hypertension Blood pressure currently stable. Continue IV Lasix. ACE inhibitor on hold secondary to acute renal failure.  #7 hyperkalemia Secondary to acute renal failure. Patient received a dose of Kayexalate on admission as well as IV insulin in the amp of D50. Repeat potassium currently at 5.2. Patient on IV Lasix. Monitor for now.  #8 hypocalcemia/elevated phosphorus level Continue current dose of PhosLo.  #9 chest pain Likely secondary to volume overload. Patient on telemetry. EKG with no ischemic changes noted. Cardiac enzymes negative to date. 2-D echo pending. Continue diuresis with high-dose IV Lasix. Follow.  #10 hyponatremia Secondary to volume overload. Follow with diuresis.   DVT prophylaxis: scd Code Status: Full Family Communication: Updated patient and wife at bedside. Disposition Plan: Home once volume overload has resolved and patient no longer hypoxic, acute renal failure has resolved and per nephrology.   Consultants:   Nephrology pending  Procedures:   Chest x-ray 10/06/2016  2-D echo  10/07/2016  Antimicrobials:   None   Subjective: Patient states no change in shortness of breath. Patient denies any chest pain. No nausea. No vomiting. Patient uncomfortable. Patient wants to sit up.  Objective: Vitals:   10/06/16 2014 10/07/16 0555 10/07/16 0915 10/07/16 0944  BP: 120/67 128/74 (!) 122/56   Pulse: 84 82 79   Resp: (!) 24 (!) 22 20   Temp: 97.8 F (36.6 C) 97.4 F (36.3 C) 97.9 F (36.6 C)   TempSrc: Oral Oral Oral   SpO2: 99% 97% 97%   Weight: (!) 143.6 kg (316 lb 9.3 oz)     Height:    _0  (1.778 m)    Intake/Output Summary (Last 24 hours) at 10/07/16 1540 Last data filed at 10/07/16 1010  Gross per 24 hour  Intake              852 ml  Output              500 ml  Net              352 ml   Filed Weights   10/06/16 2014  Weight: (!) 143.6 kg (316 lb 9.3 oz)     Examination:  General exam: Somewhat uncomfortable. Respiratory system: Bibasilar crackles. Respiratory effort normal. Cardiovascular system: S1 & S2 heard, RRR. + JVD, murmurs, rubs, gallops or clicks. 2-3+ bilateral lower extremity edema.  Gastrointestinal system: Abdomen is distended, tight and nontender. No organomegaly or masses felt. Normal bowel sounds heard. Central nervous system: Alert and oriented. No focal neurological deficits. Extremities: Symmetric 5 x 5 power.2- 3+ bilateral lower extremity edema. Skin: Erythema bilateral anterior lower extremities on the shins.  Psychiatry: Judgement and insight appear normal. Mood & affect appropriate.     Data Reviewed: I have personally reviewed following labs and imaging studies  CBC:  Recent Labs Lab 10/06/16 2101 10/07/16 1300  WBC 3.8* 4.0  NEUTROABS 2.5 2.9  HGB 7.1* 7.5*  HCT 22.7* 23.4*  MCV 95.8 95.1  PLT 69* 71*   Basic Metabolic Panel:  Recent Labs Lab 10/06/16 2101 10/07/16 1300  NA 127* 129*  K 5.8* 5.2*  CL 91* 96*  CO2 23 23  GLUCOSE 116* 124*  BUN 34* 36*  CREATININE  3.28* 3.28*  CALCIUM 7.7* 7.4*  MG 2.2 2.3  PHOS 4.9* 4.9*   GFR: Estimated Creatinine Clearance: 33.4 mL/min (by C-G formula based on SCr of 3.28 mg/dL (H)). Liver Function Tests:  Recent Labs Lab 10/06/16 2101 10/07/16 1300  AST 19  --   ALT 14*  --   ALKPHOS 85  --   BILITOT 1.2  --   PROT 11.9*  --   ALBUMIN 1.4* 1.4*   No results for input(s): LIPASE, AMYLASE in the last 168 hours. No results for input(s): AMMONIA in the last 168 hours. Coagulation Profile:  Recent Labs Lab 10/06/16 2101  INR 1.97   Cardiac Enzymes:  Recent Labs Lab 10/06/16 2101 10/07/16 0455 10/07/16 1300  TROPONINI <0.03 <0.03 <0.03   BNP (last 3 results) No results for input(s): PROBNP in the last 8760 hours. HbA1C: No results for input(s): HGBA1C in the last 72 hours. CBG:  Recent Labs Lab 10/06/16 2012 10/07/16 0809   GLUCAP 97 83   Lipid Profile: No results for input(s): CHOL, HDL, LDLCALC, TRIG, CHOLHDL, LDLDIRECT in the last 72 hours. Thyroid Function Tests: No results for input(s): TSH, T4TOTAL, FREET4, T3FREE, THYROIDAB in the last 72 hours. Anemia Panel: No results for input(s): VITAMINB12, FOLATE, FERRITIN, TIBC, IRON, RETICCTPCT in the last 72 hours. Sepsis Labs: No results for input(s): PROCALCITON, LATICACIDVEN in the last 168 hours.  No results found for this or any previous visit (from the past 240 hour(s)).       Radiology Studies: Portable Chest 1 View  Result Date: 10/06/2016 CLINICAL DATA:  Chest pain, history of pneumonia and wheezing. EXAM: PORTABLE CHEST 1 VIEW COMPARISON:  10/04/2016 FINDINGS: There is stable cardiomegaly. No aortic aneurysm. Mild vascular congestion consistent with CHF. Small bilateral pleural effusions with trace fluid in the right minor fissure. Subsegmental atelectasis in the left mid lung versus scarring. Bibasilar densities cannot exclude superimposed pneumonia. No acute osseous abnormality. IMPRESSION: Stable cardiomegaly with mild CHF. Superimposed bibasilar pneumonia would be difficult to entirely exclude. Trace bilateral pleural effusions. Electronically Signed   By: Ashley Royalty M.D.   On: 10/06/2016 22:09        Scheduled Meds: . calcium acetate  667 mg Oral TID WC  . furosemide  160 mg Intravenous Q8H  . mouth rinse  15 mL Mouth Rinse BID  . sodium chloride flush  3 mL Intravenous Q12H   Continuous Infusions:   LOS: 1 day    Time spent: 36 minutes    Remmi Armenteros, MD [facility] Pager [phone]  If 7PM-7AM, please contact night-coverage [email or web address] Password [email or web address] 10/07/2016, 3:40 PM

## 2016-10-07 NOTE — Consult Note (Addendum)
Renal Service Consult Note [facility]  Lance [name] 10/07/2016 Lance [name] D Requesting Physician:  Lance [name]  Reason for Consult:  Renal failure HPI: The patient is a 61 y.o. year-old with history of chronic CHF (EF 35-40%), HTN, multiple myeloma, pancytopenia who presented to [place] on 2/7 with SOB and CHF exacerbation.  Was anemic and required transfusions as well.  He rec'd IV lasix and had good UOP but creatinine rose up to 2.9.  K was high and he rec'd Kayexalate.  Transfer was requested for neph services.    Pt had CT abdomen at [facility] which was negative for hydronephrosis and kidney stones.  Moderate ascites was noted. CXR here showing mild CHF.  LE dopplers have been done and neg for DVT.    BP's here are 120- 130/ 55- 75.    No old chart/ no past admissions.  History per patient is that he was diagnosed with multiple myeloma "about 2 yrs ago", but he didn't take chemo at the time as his parents had both died and he was depressed, per the patient. He then did eventually start the chemo and this was about 4 weeks ago in [place] with Lance [name].   Main c/o is swelling of abdomen, abd wall, scrotum and LE's.  Also DOE and orthopnea.  No prod cough , no fevers, no abd pain, +diarrhea, no N/V.  Has foley in here.   He is single, never married, no children.  Has one sister living, here with him today.  Grew up in [place] , went to [employer], no college. 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  Trying to get on disability now.  LIves in a house in [place].  No tob Lance [name].      HOme meds > Rena Caps, Fergon, Lasix 80/d, lisinopril 2.5 mg /day, KCL 10 bid, Proair HFA    Past Medical History  Past Medical History:  Diagnosis Date  . Depression   . Diverticulosis   . Hypertension   . Multiple myeloma ([place])   . Pancytopenia [facility])    Past Surgical History  Past Surgical History:  Procedure Laterality Date   . INGUINAL HERNIA REPAIR Right    Family History  Family History  Problem Relation Age of Onset  . Hypertension Mother   . Diabetes Mother   . Bone cancer Mother   . Diabetes Father   . Prostate cancer Father   . Lung cancer Sister   . Brain cancer Brother    Social History  reports that he has never smoked. He has never used smokeless tobacco. He reports that he does not drink alcohol or use drugs. Allergies  Allergies  Allergen Reactions  . Penicillins     Unknown Has patient had a PCN reaction causing immediate rash, facial/tongue/throat swelling, SOB or lightheadedness with hypotension: YES Has patient had a PCN reaction causing severe rash involving mucus membranes or skin necrosis: NO Has patient had a PCN reaction that required hospitalization NO Has patient had a PCN reaction occurring within the last 10 years: NO If all of the above answers are "NO", then may proceed with Cephalosporin use.  . Tobacco [Nicotiana Tabacum]    Home medications Prior to Admission medications   Medication Sig Start Date End Date Taking? Authorizing Provider  B Complex-C-Folic Acid (RENO CAPS) 1 MG CAPS Take 1 capsule by mouth daily. 08/11/16  Yes Historical Provider, MD  ferrous gluconate (FERGON) 324 MG tablet  Take 1 tablet by mouth daily. 08/10/16  Yes Historical Provider, MD  furosemide (LASIX) 40 MG tablet Take 80 mg by mouth daily. 09/29/16  Yes Historical Provider, MD  lisinopril (PRINIVIL,ZESTRIL) 2.5 MG tablet Take 2.5 mg by mouth daily. 09/26/16 10/26/16 Yes Historical Provider, MD  potassium chloride (K-DUR,KLOR-CON) 10 MEQ tablet Take 1 tablet by mouth 2 (two) times daily. 09/26/16 09/26/17 Yes Historical Provider, MD  PROAIR HFA 108 (90 Base) MCG/ACT inhaler Take 1 puff by mouth daily as needed. 07/19/16  Yes Historical Provider, MD   Liver Function Tests  Recent Labs Lab 10/06/16 2101 10/07/16 1300  AST 19  --   ALT 14*  --   ALKPHOS 85  --   BILITOT 1.2  --   PROT 11.9*  --    ALBUMIN 1.4* 1.4*   No results for input(s): LIPASE, AMYLASE in the last 168 hours. CBC  Recent Labs Lab 10/06/16 2101 10/07/16 1300  WBC 3.8* 4.0  NEUTROABS 2.5 2.9  HGB 7.1* 7.5*  HCT 22.7* 23.4*  MCV 95.8 95.1  PLT 69* 71*   Basic Metabolic Panel  Recent Labs Lab 10/06/16 2101 10/07/16 1300  NA 127* 129*  K 5.8* 5.2*  CL 91* 96*  CO2 23 23  GLUCOSE 116* 124*  BUN 34* 36*  CREATININE 3.28* 3.28*  CALCIUM 7.7* 7.4*  PHOS 4.9* 4.9*   Iron/TIBC/Ferritin/ %Sat No results found for: IRON, TIBC, FERRITIN, IRONPCTSAT  Vitals:   10/07/16 0555 10/07/16 0915 10/07/16 0944 10/07/16 1703  BP: 128/74 (!) 122/56  (!) 124/59  Pulse: 82 79  82  Resp: (!) 22 20  20   Temp: 97.4 F (36.3 C) 97.9 F (36.6 C)  98.8 F (37.1 C)  TempSrc: Oral Oral  Oral  SpO2: 97% 97%  97%  Weight:      Height:   5' 10"  (1.778 m)    Exam Gen large, morbidly obese, uncomfortable and mildly SOB at rest No rash, cyanosis or gangrene Sclera anicteric, throat clear  ++JVD to angle of jaw Chest soft bibasilar rales, o/w clear RRR w 2/6 SEM, no gallop Abd marked obesity and marked ascitic distension, also marked abd wall edema and scrotal edema 3+ GU small penis, foley in place MS no joint effusions or deformity Ext 2-3+ bilat LE edema lower legs, 1-2+ thigh edema bilat / no wounds or ulcers Neuro is alert, Ox 3 , nf, no asterixis  ECHO > LVEF 35%, severe RV dysfunction, PA peak 18m Hg CXR > stable CM with mild CHF/ fluid in fissure Na 129  K 5.2  CO2 23  BUN 36  Cr 3.28  eGFR 22  Alb1.4 WBC 4k  Hb 7.5  plt 71 UA > cloudy, many bact, tntc rbc/ wbc, 100 prot, pH 6, color red INR 1.97  Assessment: 1.  Renal failure - unknown baseline, no old records here.  UA dirty w UTI.  Could have GN as well.  Most likely has myeloma kidney complicated by cardiorenal syndrome.  Massive volume overload. AGree with highest dose IV lasix + zaroxlyn.  IF this doesn't work would consider IV inotropes.   Dialysis is another option of course, but no strong indication yet for this now.   2.  CM EF 35% 3.  Morbid obestiy 4.  Mult myeloma - just started chemo 4 wks ago, was diagnosed however 2 yrs ago approx per pt 5.  HTN - holding acei appropriately, bp's ok at 120/80 6.  Pyuria - r/o UTI, send  culture    Plan - as above  Kelly Splinter MD [facility] pager [phone]   10/07/2016, 7:31 PM

## 2016-10-08 ENCOUNTER — Encounter (HOSPITAL_COMMUNITY): Payer: Self-pay | Admitting: Cardiology

## 2016-10-08 DIAGNOSIS — I5023 Acute on chronic systolic (congestive) heart failure: Secondary | ICD-10-CM

## 2016-10-08 DIAGNOSIS — R601 Generalized edema: Secondary | ICD-10-CM

## 2016-10-08 DIAGNOSIS — N179 Acute kidney failure, unspecified: Secondary | ICD-10-CM

## 2016-10-08 LAB — CBC WITH DIFFERENTIAL/PLATELET
BASOS ABS: 0 10*3/uL (ref 0.0–0.1)
Basophils Relative: 0 %
EOS ABS: 0 10*3/uL (ref 0.0–0.7)
Eosinophils Relative: 1 %
HEMATOCRIT: 23.7 % — AB (ref 39.0–52.0)
Hemoglobin: 7.6 g/dL — ABNORMAL LOW (ref 13.0–17.0)
LYMPHS ABS: 0.7 10*3/uL (ref 0.7–4.0)
Lymphocytes Relative: 17 %
MCH: 30 pg (ref 26.0–34.0)
MCHC: 32.1 g/dL (ref 30.0–36.0)
MCV: 93.7 fL (ref 78.0–100.0)
MONO ABS: 0.5 10*3/uL (ref 0.1–1.0)
MONOS PCT: 11 %
NEUTROS ABS: 3.1 10*3/uL (ref 1.7–7.7)
Neutrophils Relative %: 71 %
PLATELETS: 82 10*3/uL — AB (ref 150–400)
RBC: 2.53 MIL/uL — AB (ref 4.22–5.81)
RDW: 21.1 % — AB (ref 11.5–15.5)
WBC: 4.3 10*3/uL (ref 4.0–10.5)

## 2016-10-08 LAB — BASIC METABOLIC PANEL
Anion gap: 11 (ref 5–15)
BUN: 42 mg/dL — AB (ref 6–20)
CALCIUM: 7.6 mg/dL — AB (ref 8.9–10.3)
CHLORIDE: 95 mmol/L — AB (ref 101–111)
CO2: 23 mmol/L (ref 22–32)
CREATININE: 3.61 mg/dL — AB (ref 0.61–1.24)
GFR calc non Af Amer: 17 mL/min — ABNORMAL LOW (ref >60)
GFR, EST AFRICAN AMERICAN: 19 mL/min — AB (ref >60)
Glucose, Bld: 111 mg/dL — ABNORMAL HIGH (ref 65–99)
Potassium: 5 mmol/L (ref 3.5–5.1)
SODIUM: 129 mmol/L — AB (ref 135–145)

## 2016-10-08 LAB — RENAL FUNCTION PANEL
ALBUMIN: 1.4 g/dL — AB (ref 3.5–5.0)
Anion gap: 13 (ref 5–15)
BUN: 40 mg/dL — AB (ref 6–20)
CALCIUM: 7.7 mg/dL — AB (ref 8.9–10.3)
CO2: 23 mmol/L (ref 22–32)
CREATININE: 3.58 mg/dL — AB (ref 0.61–1.24)
Chloride: 92 mmol/L — ABNORMAL LOW (ref 101–111)
GFR calc Af Amer: 20 mL/min — ABNORMAL LOW (ref >60)
GFR, EST NON AFRICAN AMERICAN: 17 mL/min — AB (ref >60)
GLUCOSE: 102 mg/dL — AB (ref 65–99)
PHOSPHORUS: 5.3 mg/dL — AB (ref 2.5–4.6)
POTASSIUM: 5.5 mmol/L — AB (ref 3.5–5.1)
SODIUM: 128 mmol/L — AB (ref 135–145)

## 2016-10-08 LAB — MAGNESIUM: Magnesium: 2.3 mg/dL (ref 1.7–2.4)

## 2016-10-08 MED ORDER — SODIUM POLYSTYRENE SULFONATE 15 GM/60ML PO SUSP
45.0000 g | Freq: Once | ORAL | Status: AC
Start: 2016-10-08 — End: 2016-10-08
  Administered 2016-10-08: 45 g via ORAL
  Filled 2016-10-08: qty 180

## 2016-10-08 NOTE — Progress Notes (Signed)
PROGRESS NOTE    Lance [name]  HKV:[id number] DOB: March 14, 1956 DOA: 10/06/2016 PCP: No primary care provider on file.    Brief Narrative:  Lance [name] is a 61 y.o. gentleman with a history of transfusion dependent multiple myeloma, systolic heart failure with EF 35-40% in January 2018, pancytopenia, and HTN who was admitted to [facility] on 10/04/2016 for management of CHF exacerbation.  The patient has required several transfusions in the past 30 days.  He presented with shortness of breath and anasarca.  His home dose of lasix has been 10m daily (he tells me that his dose was going to be increased to 812mpo BID due to his swelling, but that had not happened prior to his admission to [facility]  He was treated with IV lasix 4015mID.  He put out 1.5L of fluid, but his creatinine has been rising.  Today, BUN 30, creatinine 2.9, potassium 6.1, normal bicarb.  His baseline creatinine is 1.  He received kayexalate for the elevated potassium level prior to transfer.  Transfer requested for nephrology consultation.  [facility].  He had a CT of the abdomen and pelvis without contrast that was negative for hydronephrosis and kidney stones..  Moderate ascites noted.  Chest xray showed cardiomegaly, mild CHF, and small bilateral effusions.  Lower extremity dopplers were negative for DVT.  At time of admission here, he reports intermittent back pain that radiates anteriorly to his midchest.  He has had some light-headeness, but no LOC.  He also reports difficulty urinating due to scrotal swelling.    Assessment & Plan:   Principal Problem:   Acute respiratory failure with hypoxia (HCC) Active Problems:   Acute on chronic systolic CHF (congestive heart failure) (HCC)   ARF (acute renal failure) (HCC)   HTN (hypertension)   Anasarca   Hyperkalemia   Hyperphosphatemia   Hypocalcemia   Anemia   Multiple myeloma (HCC)   Chest pain   #1 acute respiratory  failure with hypoxia secondary to acute on chronic systolic heart failure/cardiomyopathy Secondary to acute on chronic systolic heart failure. Patient presented from outside hospital after presentation with heart failure and worsening renal function. Patient current weight on admission is 143.6 kg. Patient's weight on 09/26/2016 was 132 kg at his cardiologist's office. Cardiac enzymes negative 3. BNP elevated at 1535.9. It is noted per patient's cardiologist note that patient did have an EF of 35-40% which was a recently new discovery. Repeat 2-D echo with a EF of 35%, diffuse hypokinesis moderately dilated right ventricle with severely reduced contraction, elevated CVP. Patient with a urine output of 1600 mL over the past 24 hours and patient is -1.1 L during this hospitalization. Weights pending for today. Patient on high-dose Lasix 160 mg IV every 8 hours and Zaroxolyn 10 mg twice a day. Patient with no significant output has still significantly volume overloaded. Patient has been seen in consultation by nephrology who recommended possible inotrpoes. If patient with no significant diuresis and worsening renal function may need temporary hemodialysis. Strict I's and O's. Daily weights. Consult with cardiology for further evaluation and recommendations.  #2 acute renal failure Questionable etiology. Likely secondary to multiple myeloma and acute systolic heart failure in the setting of ACE inhibitor. Per care everywhere patient's creatinine was 1.16 on 09/26/2016 and then subsequently 1.87 on 10/02/2016. Patient recently started on lisinopril on 1/30/ 2018 per his cardiologist. Urinalysis on admission with 100 protein, nitrite negative, leukocytes negative, glucose of 50. Renal ultrasound with  no signs of hydronephrosis, ascites. No significant change in renal function. Patient on high-dose Lasix 160 mg IV every 8 hours and Zaroxolyn. Urine output of 1600 mL over the past 24 hours. Strict I's and O's. Daily  weights. If worsening renal function and poor urine output with no significant diuresis may likely need hemodialysis. Nephrology following.   #3 multiple myeloma Patient being followed by oncology in the outpatient setting and has recently required transfusions secondary to severe anemia. Patient noted to have a history of iron deficiency anemia. Follow H&H. Transfusion threshold hemoglobin less than 7.  #4 anemia Likely secondary to problem #3. Check an anemia panel. Follow H&H. Transfusion threshold hemoglobin less than 7.  #5 anasarca See problem #1.  #6 hypertension Blood pressure currently stable. Continue IV Lasix. ACE inhibitor on hold secondary to acute renal failure.  #7 hyperkalemia Secondary to acute renal failure. Patient received a dose of Kayexalate on admission as well as IV insulin in the amp of D50. Repeat potassium currently at 5. 5. Patient on IV Lasix. Kayexalate 45 g by mouth 1. Monitor for now.  #8 hypocalcemia/elevated phosphorus level Continue current dose of PhosLo.  #9 chest pain Likely secondary to volume overload. Patient on telemetry. EKG with no ischemic changes noted. Cardiac enzymes negative to date. 2-D echo with EF of 35%, mild LVH, diffuse hypokinesis with no significant change from 2-D echo noted on patient's cardiologist note on recent visit of 09/26/2016. Patient on high-dose IV Lasix as well as Zaroxolyn with minimal urine output. Continue diuresis with high-dose IV Lasix and Zaroxolyn. Follow.  #10 hyponatremia Secondary to volume overload. Follow with diuresis.   DVT prophylaxis: scd Code Status: Full Family Communication: Updated patient and wife at bedside. Disposition Plan: Home once volume overload has resolved and patient no longer hypoxic, acute renal failure has resolved and per nephrology.   Consultants:   Nephrology Dr Jonnie Finner: 10/07/2016  Cardiology pending  Procedures:   Chest x-ray 10/06/2016  2-D echo   10/07/2016  Renal ultrasound 10/07/2016  Antimicrobials:   None   Subjective: Patient states no significant change in shortness of breath. Patient denies any chest pain. No nausea. No vomiting. Patient sitting up at the side of the bed.   Objective: Vitals:   10/07/16 1703 10/07/16 2248 10/08/16 0630 10/08/16 0940  BP: (!) 124/59 (!) 113/44 117/61 123/60  Pulse: 82 82 80 82  Resp: 20 18 20 18   Temp: 98.8 F (37.1 C) 97.9 F (36.6 C) 97.4 F (36.3 C) 97.5 F (36.4 C)  TempSrc: Oral Oral Oral Oral  SpO2: 97% 96% 100% 100%  Weight:  (!) 142.7 kg (314 lb 8 oz)    Height:  5' 10"  (1.778 m)      Intake/Output Summary (Last 24 hours) at 10/08/16 1312 Last data filed at 10/08/16 0940  Gross per 24 hour  Intake              546 ml  Output             1600 ml  Net            -1054 ml   Filed Weights   10/06/16 2014 10/07/16 2248  Weight: (!) 143.6 kg (316 lb 9.3 oz) (!) 142.7 kg (314 lb 8 oz)    Examination:  General exam: Somewhat uncomfortable. Respiratory system: Bibasilar crackles. Respiratory effort normal. Cardiovascular system: S1 & S2 heard, RRR. + JVD, murmurs, rubs, gallops or clicks. 2-3+ bilateral lower extremity edema.  Gastrointestinal  system: Abdomen is distended, tight and nontender. No organomegaly or masses felt. Normal bowel sounds heard. Central nervous system: Alert and oriented. No focal neurological deficits. Extremities: Symmetric 5 x 5 power.2- 3+ bilateral lower extremity edema. Skin: Erythema bilateral anterior lower extremities on the shins.  Psychiatry: Judgement and insight appear normal. Mood & affect appropriate.     Data Reviewed: I have personally reviewed following labs and imaging studies  CBC:  Recent Labs Lab 10/06/16 2101 10/07/16 1300 10/08/16 0147  WBC 3.8* 4.0 4.3  NEUTROABS 2.5 2.9 3.1  HGB 7.1* 7.5* 7.6*  HCT 22.7* 23.4* 23.7*  MCV 95.8 95.1 93.7  PLT 69* 71* 82*   Basic Metabolic Panel:  Recent Labs Lab  10/06/16 2101 10/07/16 1300 10/08/16 0147 10/08/16 1212  NA 127* 129* 128* 129*  K 5.8* 5.2* 5.5* 5.0  CL 91* 96* 92* 95*  CO2 23 23 23 23   GLUCOSE 116* 124* 102* 111*  BUN 34* 36* 40* 42*  CREATININE 3.28* 3.28* 3.58* 3.61*  CALCIUM 7.7* 7.4* 7.7* 7.6*  MG 2.2 2.3 2.3  --   PHOS 4.9* 4.9* 5.3*  --    GFR: Estimated Creatinine Clearance: 30.3 mL/min (by C-G formula based on SCr of 3.61 mg/dL (H)). Liver Function Tests:  Recent Labs Lab 10/06/16 2101 10/07/16 1300 10/08/16 0147  AST 19  --   --   ALT 14*  --   --   ALKPHOS 85  --   --   BILITOT 1.2  --   --   PROT 11.9*  --   --   ALBUMIN 1.4* 1.4* 1.4*   No results for input(s): LIPASE, AMYLASE in the last 168 hours. No results for input(s): AMMONIA in the last 168 hours. Coagulation Profile:  Recent Labs Lab 10/06/16 2101  INR 1.97   Cardiac Enzymes:  Recent Labs Lab 10/06/16 2101 10/07/16 0455 10/07/16 1300  TROPONINI <0.03 <0.03 <0.03   BNP (last 3 results) No results for input(s): PROBNP in the last 8760 hours. HbA1C: No results for input(s): HGBA1C in the last 72 hours. CBG:  Recent Labs Lab 10/06/16 2012 10/07/16 0809  GLUCAP 97 83   Lipid Profile: No results for input(s): CHOL, HDL, LDLCALC, TRIG, CHOLHDL, LDLDIRECT in the last 72 hours. Thyroid Function Tests: No results for input(s): TSH, T4TOTAL, FREET4, T3FREE, THYROIDAB in the last 72 hours. Anemia Panel: No results for input(s): VITAMINB12, FOLATE, FERRITIN, TIBC, IRON, RETICCTPCT in the last 72 hours. Sepsis Labs: No results for input(s): PROCALCITON, LATICACIDVEN in the last 168 hours.  No results found for this or any previous visit (from the past 240 hour(s)).       Radiology Studies: US Renal  Result Date: 10/07/2016 CLINICAL DATA:  Acute renal failure EXAM: RENAL / URINARY TRACT ULTRASOUND COMPLETE COMPARISON:  None. FINDINGS: Right Kidney: Length: 12.2 cm. Echogenicity within normal limits. No mass or hydronephrosis  visualized. Left Kidney: Length: 12.7 cm. Echogenicity within normal limits. No mass or hydronephrosis visualized. Bladder: The bladder is decompressed with a Foley catheter. Ascites is seen in the abdomen. IMPRESSION: 1. Limited views of the kidneys due to patient body habitus. No acute abnormality. 2. Ascites. Electronically Signed   By: Dorise Bullion III M.D   On: 10/07/2016 21:29   Portable Chest 1 View  Result Date: 10/06/2016 CLINICAL DATA:  Chest pain, history of pneumonia and wheezing. EXAM: PORTABLE CHEST 1 VIEW COMPARISON:  10/04/2016 FINDINGS: There is stable cardiomegaly. No aortic aneurysm. Mild vascular congestion consistent with CHF.  Small bilateral pleural effusions with trace fluid in the right minor fissure. Subsegmental atelectasis in the left mid lung versus scarring. Bibasilar densities cannot exclude superimposed pneumonia. No acute osseous abnormality. IMPRESSION: Stable cardiomegaly with mild CHF. Superimposed bibasilar pneumonia would be difficult to entirely exclude. Trace bilateral pleural effusions. Electronically Signed   By: Ashley Royalty M.D.   On: 10/06/2016 22:09        Scheduled Meds: . calcium acetate  667 mg Oral TID WC  . furosemide  160 mg Intravenous Q6H  . mouth rinse  15 mL Mouth Rinse BID  . metolazone  10 mg Oral BID  . sodium chloride flush  3 mL Intravenous Q12H   Continuous Infusions:   LOS: 2 days    Time spent: 40 minutes    Brandolyn Shortridge, MD [facility] Pager [phone]  If 7PM-7AM, please contact night-coverage [email or web address] Password [email or web address] 10/08/2016, 1:12 PM

## 2016-10-08 NOTE — Consult Note (Signed)
Requesting provider: Dr. Irine Seal Consulting cardiologist: Dr. Satira Sark  Reason for consultation: Anasarca, cardiomyopathy  Clinical Summary Mr. Deshmukh is a 61 y.o.male with past medical history outlined below, currently hospitalized with progressive renal insufficiency in association with significant volume overload/anasarca as well as cardiomyopathy that was diagnosed at [facility] in December 2017. His cardiac history is complicated by multiple myeloma with associated anemia and thrombocytopenia. He has been on escalating diuretics as an outpatient, had creatinine of 1.2-1.8 in late January to early February. He has not had optimal urine output even on very high-dose IV Lasix as directed by Nephrology. Creatinine has climbed up to 3.6.  Echocardiogram obtained yesterday is outlined below. LVEF is approximately 35%, indeterminate diastolic function. There is also moderate right ventricular dilatation with severely reduced contraction. PASP was estimated at 34 mmHg but LV septal flattening suggests that this could be higher.  He has no documented history of ischemic heart disease or cardiac arrhythmia. Troponin I levels argue against ACS.  Allergies  Allergen Reactions  . Penicillins     Unknown Has patient had a PCN reaction causing immediate rash, facial/tongue/throat swelling, SOB or lightheadedness with hypotension: YES Has patient had a PCN reaction causing severe rash involving mucus membranes or skin necrosis: NO Has patient had a PCN reaction that required hospitalization NO Has patient had a PCN reaction occurring within the last 10 years: NO If all of the above answers are "NO", then may proceed with Cephalosporin use.  . Tobacco [Nicotiana Tabacum]     Medications Scheduled Medications: . calcium acetate  667 mg Oral TID WC  . furosemide  160 mg Intravenous Q6H  . mouth rinse  15 mL Mouth Rinse BID  . metolazone  10 mg Oral BID  . sodium chloride  flush  3 mL Intravenous Q12H     PRN Medications: acetaminophen **OR** acetaminophen, calcium carbonate (dosed in mg elemental calcium), camphor-menthol **AND** hydrOXYzine, docusate sodium, feeding supplement (NEPRO CARB STEADY), ipratropium-albuterol, ondansetron **OR** ondansetron (ZOFRAN) IV, sorbitol, zolpidem   Past Medical History:  Diagnosis Date  . Depression   . Dilated cardiomyopathy ([place])   . Diverticulosis   . Hypertension   . Iron deficiency anemia   . Multiple myeloma ([place])   . Pancytopenia [facility])     Past Surgical History:  Procedure Laterality Date  . INGUINAL HERNIA REPAIR Right     Family History  Problem Relation Age of Onset  . Hypertension Mother   . Diabetes Mother   . Bone cancer Mother   . Diabetes Father   . Prostate cancer Father   . Lung cancer Sister   . Brain cancer Brother     Social History Mr. Dolores reports that he has never smoked. He has never used smokeless tobacco. Mr. Snee reports that he does not drink alcohol.  Review of Systems Complete review of systems negative except as otherwise outlined in the clinical summary and also the following. Progressive fatigue.  Physical Examination Blood pressure 123/60, pulse 82, temperature 97.5 F (36.4 C), temperature source Oral, resp. rate 18, height 5' 10"  (1.778 m), weight (!) 314 lb 8 oz (142.7 kg), SpO2 100 %.  Intake/Output Summary (Last 24 hours) at 10/08/16 1449 Last data filed at 10/08/16 0940  Gross per 24 hour  Intake              546 ml  Output             1600 ml  Net            -  1054 ml   Telemetry: Sinus rhythm.  Gen.: Morbidly obese, chronically ill-appearing male in no distress. HEENT: Conjunctiva and lids normal, oropharynx clear. Neck: Supple, elevated JVP to angle of the jaw, no carotid bruits, no thyromegaly. Lungs: Scattered basilar crackles, nonlabored breathing at rest. Cardiac: Indistinct PMI, RRR , soft  S3, no pericardial rub. Abdomen: Protuberant,  bowel sounds present. Extremities: Markedly bilateral leg edema up to the sacrum, distal pulses 2+. Skin: Warm and dry. Musculoskeletal: No kyphosis. Neuropsychiatric: Alert and oriented x3, affect grossly appropriate.  Lab Results  Basic Metabolic Panel:  Recent Labs Lab 10/06/16 2101 10/07/16 1300 10/08/16 0147 10/08/16 1212  NA 127* 129* 128* 129*  K 5.8* 5.2* 5.5* 5.0  CL 91* 96* 92* 95*  CO2 23 23 23 23   GLUCOSE 116* 124* 102* 111*  BUN 34* 36* 40* 42*  CREATININE 3.28* 3.28* 3.58* 3.61*  CALCIUM 7.7* 7.4* 7.7* 7.6*  MG 2.2 2.3 2.3  --   PHOS 4.9* 4.9* 5.3*  --     Liver Function Tests:  Recent Labs Lab 10/06/16 2101 10/07/16 1300 10/08/16 0147  AST 19  --   --   ALT 14*  --   --   ALKPHOS 85  --   --   BILITOT 1.2  --   --   PROT 11.9*  --   --   ALBUMIN 1.4* 1.4* 1.4*    CBC:  Recent Labs Lab 10/06/16 2101 10/07/16 1300 10/08/16 0147  WBC 3.8* 4.0 4.3  NEUTROABS 2.5 2.9 3.1  HGB 7.1* 7.5* 7.6*  HCT 22.7* 23.4* 23.7*  MCV 95.8 95.1 93.7  PLT 69* 71* 82*    Cardiac Enzymes:  Recent Labs Lab 10/06/16 2101 10/07/16 0455 10/07/16 1300  TROPONINI <0.03 <0.03 <0.03    BNP: 1535  ECG I personally reviewed the tracing from 10/07/2016 which shows sinus rhythm with low voltage, poor R-wave progression, rule out old inferior infarct pattern.  Imaging  Echocardiogram 10/07/2016: Study Conclusions  - Left ventricle: The cavity size was normal. Wall thickness was   increased in a pattern of mild LVH. The estimated ejection   fraction was 35%. Diffuse hypokinesis. Regional wall motion   abnormalities cannot be excluded. The study is not technically   sufficient to allow evaluation of LV diastolic function. - Ventricular septum: The contour showed diastolic flattening and   systolic flattening. - Aortic valve: Mildly calcified annulus. Trileaflet; mildly   calcified leaflets. - Mitral valve: Calcified annulus. There was trivial  regurgitation. - Left atrium: The atrium was mildly dilated. - Right ventricle: The cavity size was moderately dilated. Systolic   function was severely reduced. - Right atrium: The atrium was moderately dilated. Central venous   pressure (est): 15 mm Hg. - Atrial septum: No defect or patent foramen ovale was identified. - Tricuspid valve: There was mild regurgitation. - Pulmonary arteries: PA peak pressure: 34 mm Hg (S). - Pericardium, extracardiac: There was no pericardial effusion.  Impressions:  - Mild LVH with LVEF approximately 35%. Diffuse hypokinesis, cannot   entirely exclude focal wall motion abnormalities. Indeterminate   diastolic function. Mild left atrial enlargement. Moderately   dilated right ventricle with severely reduced contraction. There   is LV septal flattening suggestive of increased right ventricular   pressure and volume. Mild tricuspid regurgitation with PASP   estimated 34 mmHg. CVP is elevated.  Chest x-ray 10/06/2016: FINDINGS: There is stable cardiomegaly. No aortic aneurysm. Mild vascular congestion consistent with CHF. Small bilateral pleural effusions with  trace fluid in the right minor fissure. Subsegmental atelectasis in the left mid lung versus scarring. Bibasilar densities cannot exclude superimposed pneumonia. No acute osseous abnormality.  IMPRESSION: Stable cardiomegaly with mild CHF. Superimposed bibasilar pneumonia would be difficult to entirely exclude. Trace bilateral pleural effusions.  Impression  1. Massive volume overload/anasarca, patient states that his weight had been in the 290s back in November 2017, now up to 315. He does have a cardiomyopathy with LVEF approximately 35%, however also severe right ventricular dysfunction which may be more the problem in terms of his anasarca. On very high-dose IV Lasix as well as Zaroxolyn per Nephrology he has put out about 1000 cc, but creatinine has increased to 3.6. Otherwise not on ACE  inhibitor or ARB now, no beta blocker. Etiology of his cardiomyopathy is not clear at this point, but potentially nonischemic.  2. Multiple myeloma.  3. Essential hypertension.  4. Iron deficiency anemia and thrombocytopenia.  5. Acute renal failure.   Recommendations  Difficult treatment scenario particularly in in terms of his right ventricular dysfunction. We will ask the Heart Failure team to see him regarding potential use of inotropes +/- right heart catheterization. Wonder if CVVH would be an option. Placing a PICC line probably not an option with his current degree of renal insufficiency.  Satira Sark, M.D., F.A.C.C.

## 2016-10-08 NOTE — Progress Notes (Signed)
[facility] Progress Note   Subjective: Care Every where shows that Creat was 1.16 on Sep 26, 2016, and 1.87 on Feb 5th 2018.  On max diuretics patient voided 1600 cc yesterday.  Still tired, swollen.   Vitals:   10/07/16 1703 10/07/16 2248 10/08/16 0630 10/08/16 0940  BP: (!) 124/59 (!) 113/44 117/61 123/60  Pulse: 82 82 80 82  Resp: 20 18 20 18   Temp: 98.8 F (37.1 C) 97.9 F (36.6 C) 97.4 F (36.3 C) 97.5 F (36.4 C)  TempSrc: Oral Oral Oral Oral  SpO2: 97% 96% 100% 100%  Weight:  (!) 142.7 kg (314 lb 8 oz)    Height:  5' 10"  (1.778 m)      Inpatient medications: . calcium acetate  667 mg Oral TID WC  . furosemide  160 mg Intravenous Q6H  . mouth rinse  15 mL Mouth Rinse BID  . metolazone  10 mg Oral BID  . sodium chloride flush  3 mL Intravenous Q12H    acetaminophen **OR** acetaminophen, calcium carbonate (dosed in mg elemental calcium), camphor-menthol **AND** hydrOXYzine, docusate sodium, feeding supplement (NEPRO CARB STEADY), ipratropium-albuterol, ondansetron **OR** ondansetron (ZOFRAN) IV, sorbitol, zolpidem  Exam: Gen large, morbidly obese, mildly SOB at rest ++JVD to angle of jaw Chest soft bibasilar rales, o/w clear RRR w 2/6 SEM, no gallop Abd marked obesity and marked ascites/ distension, also marked abd wall edema and scrotal edema 3+ GU small penis, foley in place MS no joint effusions or deformity Ext 2-3+ bilat LE edema lower legs, 1-2+ thigh edema bilat Neuro is alert, Ox 3 , nf, no asterixis  ECHO > LVEF 35%, severe RV dysfunction, PA peak 29m Hg CXR > stable CM with mild CHF/ fluid in fissure Na 129  K 5.2  CO2 23  BUN 36  Cr 3.28  eGFR 22  Alb1.4 WBC 4k  Hb 7.5  plt 71 UA > cloudy, many bact, tntc rbc/ wbc, 100 prot, pH 6, color red INR 1.97  Assessment: 1.  Renal failure - per Care Everywhere, creat was 1.16 on Jan 30th and 1.87 on Feb 5th. Creat now 3.3 in setting of massive anasarca/ R sided vol overload and untreated  myeloma for 2 yrs (just started Rx 4 weeks ago).  Can't distinguish really between myeloma kidney and cardiorenal syndrome. Also could have GN/ ATN/ AIN. Massive volume overload. Is now getting max diuretics and UOP is better but still may not be adequate for his degree of anasarca.  Would ask cardiology for input, question if inotropes might help.  Other options include CRRT and/or regular HD to get volume off.  For now cont w diuretics.   3.  Morbid obestiy 4.  Mult myeloma - just started chemo 4 wks ago, was diagnosed however 2 yrs ago approx per pt.  May need to get input from Oncology here.  5.  HTN - holding acei appropriately, bp's ok at 120/80 6.  Pyuria - r/o UTI, send culture   Plan - as above   RKelly SplinterMD [facility] pager [phone]  10/08/2016, 1:30 PM    Recent Labs Lab 10/06/16 2101 10/07/16 1300 10/08/16 0147 10/08/16 1212  NA 127* 129* 128* 129*  K 5.8* 5.2* 5.5* 5.0  CL 91* 96* 92* 95*  CO2 23 23 23 23   GLUCOSE 116* 124* 102* 111*  BUN 34* 36* 40* 42*  CREATININE 3.28* 3.28* 3.58* 3.61*  CALCIUM 7.7* 7.4* 7.7* 7.6*  PHOS 4.9* 4.9*  5.3*  --     Recent Labs Lab 10/06/16 2101 10/07/16 1300 10/08/16 0147  AST 19  --   --   ALT 14*  --   --   ALKPHOS 85  --   --   BILITOT 1.2  --   --   PROT 11.9*  --   --   ALBUMIN 1.4* 1.4* 1.4*    Recent Labs Lab 10/06/16 2101 10/07/16 1300 10/08/16 0147  WBC 3.8* 4.0 4.3  NEUTROABS 2.5 2.9 3.1  HGB 7.1* 7.5* 7.6*  HCT 22.7* 23.4* 23.7*  MCV 95.8 95.1 93.7  PLT 69* 71* 82*   Iron/TIBC/Ferritin/ %Sat No results found for: IRON, TIBC, FERRITIN, IRONPCTSAT

## 2016-10-08 NOTE — Progress Notes (Addendum)
   Case discussed with Dr. Domenic Polite. ECG, echo and Care Everywhere notes reviewed personally.   61 y/o male with apparent longstanding multiple myeloma (per Dr. Wendy Poet note in [place]) now with severe biventricular HF, renal failure and massive volume overload (R> L HF) not responding to high-dose diuretics.   With LVH on echo, low volts on ECG and marked protein gap of 10.5 g/dl he has AL cardiac amyloidosis until proven otherwise.   With renal failure is not candidate for cMRI. Can consdier fat pad biopsy but at this point I think diagnosis is academic.   Agree with trial of inotropic support with milrinone or dopamine. I will staff formally in am. If in fact this is cardiac amyloid, his prognosis is unfortunately extremely poor (weeks to months) in the presence of advanced biventricular HF and we will likely need Paliative Care involvement.   Dawson Hollman,MD 7:12 PM

## 2016-10-09 DIAGNOSIS — I43 Cardiomyopathy in diseases classified elsewhere: Secondary | ICD-10-CM

## 2016-10-09 DIAGNOSIS — E854 Organ-limited amyloidosis: Secondary | ICD-10-CM

## 2016-10-09 DIAGNOSIS — N170 Acute kidney failure with tubular necrosis: Secondary | ICD-10-CM

## 2016-10-09 LAB — RENAL FUNCTION PANEL
ANION GAP: 13 (ref 5–15)
Albumin: 1.5 g/dL — ABNORMAL LOW (ref 3.5–5.0)
BUN: 45 mg/dL — AB (ref 6–20)
CHLORIDE: 91 mmol/L — AB (ref 101–111)
CO2: 24 mmol/L (ref 22–32)
Calcium: 7.4 mg/dL — ABNORMAL LOW (ref 8.9–10.3)
Creatinine, Ser: 3.7 mg/dL — ABNORMAL HIGH (ref 0.61–1.24)
GFR calc Af Amer: 19 mL/min — ABNORMAL LOW (ref >60)
GFR, EST NON AFRICAN AMERICAN: 16 mL/min — AB (ref >60)
Glucose, Bld: 91 mg/dL (ref 65–99)
POTASSIUM: 4.8 mmol/L (ref 3.5–5.1)
Phosphorus: 5.6 mg/dL — ABNORMAL HIGH (ref 2.5–4.6)
Sodium: 128 mmol/L — ABNORMAL LOW (ref 135–145)

## 2016-10-09 LAB — COOXEMETRY PANEL
Carboxyhemoglobin: 1 % (ref 0.5–1.5)
METHEMOGLOBIN: 1 % (ref 0.0–1.5)
O2 Saturation: 57.9 %
Total hemoglobin: 12.1 g/dL (ref 12.0–16.0)

## 2016-10-09 LAB — MRSA PCR SCREENING: MRSA BY PCR: NEGATIVE

## 2016-10-09 LAB — FERRITIN: Ferritin: 186 ng/mL (ref 24–336)

## 2016-10-09 LAB — URINE CULTURE: CULTURE: NO GROWTH

## 2016-10-09 LAB — IRON AND TIBC
IRON: 79 ug/dL (ref 45–182)
Saturation Ratios: 44 % — ABNORMAL HIGH (ref 17.9–39.5)
TIBC: 181 ug/dL — AB (ref 250–450)
UIBC: 102 ug/dL

## 2016-10-09 LAB — CBC
HCT: 22.6 % — ABNORMAL LOW (ref 39.0–52.0)
Hemoglobin: 7.3 g/dL — ABNORMAL LOW (ref 13.0–17.0)
MCH: 30.3 pg (ref 26.0–34.0)
MCHC: 32.3 g/dL (ref 30.0–36.0)
MCV: 93.8 fL (ref 78.0–100.0)
PLATELETS: 80 10*3/uL — AB (ref 150–400)
RBC: 2.41 MIL/uL — ABNORMAL LOW (ref 4.22–5.81)
RDW: 21.2 % — AB (ref 11.5–15.5)
WBC: 3.2 10*3/uL — AB (ref 4.0–10.5)

## 2016-10-09 LAB — VITAMIN B12: VITAMIN B 12: 320 pg/mL (ref 180–914)

## 2016-10-09 LAB — FOLATE: Folate: 10.4 ng/mL (ref >5.9)

## 2016-10-09 LAB — GLUCOSE, CAPILLARY: GLUCOSE-CAPILLARY: 94 mg/dL (ref 65–99)

## 2016-10-09 MED ORDER — SODIUM CHLORIDE 0.9% FLUSH: 10.0000 mL | INTRAVENOUS | Status: DC | PRN

## 2016-10-09 MED ORDER — DOPAMINE-DEXTROSE 3.2-5 MG/ML-% IV SOLN
2.5000 ug/kg/min | INTRAVENOUS | Status: DC
  Administered 2016-10-09 – 2016-10-18 (×7): 2.5 ug/kg/min via INTRAVENOUS
  Filled 2016-10-09 (×7): qty 250

## 2016-10-09 MED ORDER — CYANOCOBALAMIN 1000 MCG/ML IJ SOLN
1000.0000 ug | Freq: Every day | INTRAMUSCULAR | Status: DC
  Administered 2016-10-09 – 2016-10-17 (×9): 1000 ug via INTRAMUSCULAR
  Filled 2016-10-09 (×9): qty 1

## 2016-10-09 MED ORDER — SODIUM CHLORIDE 0.9% FLUSH
10.0000 mL | Freq: Two times a day (BID) | INTRAVENOUS | Status: DC
  Administered 2016-10-09: 10 mL
  Administered 2016-10-10 – 2016-10-11 (×2): 20 mL
  Administered 2016-10-12: 10 mL
  Administered 2016-10-13: 20 mL
  Administered 2016-10-13 – 2016-10-15 (×3): 10 mL
  Administered 2016-10-16: 20 mL
  Administered 2016-10-18: 30 mL
  Administered 2016-10-19 (×2): 10 mL

## 2016-10-09 NOTE — Progress Notes (Signed)
PROGRESS NOTE    Lance [name]  BHA:[id number] DOB: [birth year] DOA: 10/06/2016 PCP: No primary care provider on file.    Brief Narrative:  Lance [name] is a 61 y.o. gentleman with a history of transfusion dependent multiple myeloma, systolic heart failure with EF 35-40% in January 2018, pancytopenia, and HTN who was admitted to [facility] on 10/04/2016 for management of CHF exacerbation.  The patient has required several transfusions in the past 30 days.  He presented with shortness of breath and anasarca.  His home dose of lasix has been 88m daily (he tells me that his dose was going to be increased to 86mpo BID due to his swelling, but that had not happened prior to his admission to [facility]  He was treated with IV lasix 4061mID.  He put out 1.5L of fluid, but his creatinine has been rising.  Today, BUN 30, creatinine 2.9, potassium 6.1, normal bicarb.  His baseline creatinine is 1.  He received kayexalate for the elevated potassium level prior to transfer.  Transfer requested for nephrology consultation.  At [facility] was 13000.  He had a CT of the abdomen and pelvis without contrast that was negative for hydronephrosis and kidney stones..  Moderate ascites noted.  Chest xray showed cardiomegaly, mild CHF, and small bilateral effusions.  Lower extremity dopplers were negative for DVT.  At time of admission here, he reports intermittent back pain that radiates anteriorly to his midchest.  He has had some light-headeness, but no LOC.  He also reports difficulty urinating due to scrotal swelling.  Patient admitted with anasarca and acute on chronic systolic heart failure with worsening renal function and a history of multiple myeloma currently transfusion dependent. Patient placed on high-dose IV Lasix and Zaroxolyn however no significant diuresis. Nephrology and cardiology consulted. Patient transferred to the unit for inotropic support.    Assessment & Plan:     Principal Problem:   Acute respiratory failure with hypoxia (HCC) Active Problems:   Acute on chronic systolic CHF (congestive heart failure) (HCC)   ARF (acute renal failure) (HCC)   HTN (hypertension)   Anasarca   Hyperkalemia   Hyperphosphatemia   Hypocalcemia   Anemia   Multiple myeloma (HCC)   Chest pain   #1 acute respiratory failure with hypoxia secondary to acute on chronic systolic heart failure/cardiomyopathy Secondary to acute on chronic systolic heart failure. Patient presented from outside hospital after presentation with heart failure and worsening renal function. Patient current weight on admission is 143.6 kg. Patient's weight on 09/26/2016 was 132 kg at his cardiologist's office. Cardiac enzymes negative 3. BNP elevated at 1535.9. It is noted per patient's cardiologist note that patient did have an EF of 35-40% which was a recently new discovery. Repeat 2-D echo with a EF of 35%, diffuse hypokinesis moderately dilated right ventricle with severely reduced contraction, elevated CVP. Patient with a urine output of 3400 mL over the past 24 hours and patient is -3.172 L during this hospitalization. Weights pending for today. Patient on high-dose Lasix 160 mg IV every 8 hours and Zaroxolyn 10 mg twice a day. Patient with no significant output has still significantly volume overloaded. Patient has been seen in consultation by nephrology who recommended possible inotrpoes. Patient is seen in consultation by cardiology and the heart failure team who are recommending a trial of inotropic support. PICC line has been placed. Patient be transferred to the unit for inotropic support. Cardiology also concerned about amyloidosis as patient noted to  have LVH on echo, low voltage on EKG and market protein gap of 10.5 g/dL. If patient with no significant diuresis and worsening renal function may need temporary hemodialysis. Strict I's and O's. Daily weights. Cardiology and nephrology following and  appreciate input and recommendations.   #2 acute renal failure Questionable etiology. Likely secondary to multiple myeloma and acute systolic heart failure in the setting of ACE inhibitor. Per care everywhere patient's creatinine was 1.16 on 09/26/2016 and then subsequently 1.87 on 10/02/2016. Patient recently started on lisinopril on 1/30/ 2018 per his cardiologist. Urinalysis on admission with 100 protein, nitrite negative, leukocytes negative, glucose of 50. Renal ultrasound with no signs of hydronephrosis, ascites. No significant change in renal function. Patient on high-dose Lasix 160 mg IV every 8 hours and Zaroxolyn. Urine output of 3400 mL over the past 24 hours. Strict I's and O's. Daily weights. Patient has been seen in consultation by cardiology who are recommending a trial of inotropic support. Urology January show If worsening renal function and poor urine output with no significant diuresis may likely need hemodialysis. Nephrology following.   #3 multiple myeloma Patient being followed by oncology in the outpatient setting and has recently required transfusions secondary to severe anemia. Patient noted to have a history of iron deficiency anemia. Follow H&H. Transfusion threshold hemoglobin less than 7.  #4 anemia Likely secondary to problem #3. Anemia panel consistent with anemia of chronic disease. B-12 levels on the low side at 320. Hemoglobin currently at 7.3. Place on B-12 IM injections. Follow H&H. Transfusion threshold hemoglobin less than 7.  #5 anasarca See problem #1.  #6 hypertension Blood pressure currently stable. Continue IV Lasix. ACE inhibitor on hold secondary to acute renal failure.  #7 hyperkalemia Secondary to acute renal failure. Patient received a dose of Kayexalate on admission as well as IV insulin in the amp of D50. Repeat potassium at 5. 5 on 10/08/2016. Patient on IV Lasix and also given a dose of Kayexalate 45 g by mouth 1 on 10/08/2016 with resolution of  hyperkalemia. Potassium today is 4.8.   #8 hypocalcemia/elevated phosphorus level Continue current dose of PhosLo.  #9 chest pain Likely secondary to volume overload. Patient on telemetry. EKG with no ischemic changes noted. Cardiac enzymes negative to date. 2-D echo with EF of 35%, mild LVH, diffuse hypokinesis with no significant change from 2-D echo noted on patient's cardiologist note on recent visit of 09/26/2016. Patient on high-dose IV Lasix as well as Zaroxolyn with minimal urine output. Continue diuresis with high-dose IV Lasix and Zaroxolyn. Patient has been seen in consultation by cardiology who had recommended a trial of inotropic support with milrinone and dopamine. Patient to be transferred to the stepdown unit/ICU for inotropic support. Follow.  #10 hyponatremia Secondary to volume overload. Follow with diuresis.  #11 prognosis Patient with a history of cardiomyopathy with EF of 35%, history of multiple myeloma with associated anemia and thrombocytopenia with ongoing recent transfusions presented to the hospital with anasarca, acute on chronic systolic heart failure with worsening renal function outside hospital. Patient also noted to have LVH on echo, low voltage and market protein gap of 10.5 g per 10th liter worrisome for cardiac amyloidosis now on high-dose IV Lasix and Zaroxolyn with no significant improvement with renal function and no significant diuresis. Patient being transferred to stepdown unit/ICU for inotropic support per cardiology recommendations. Patient with a poor prognosis. Will consult with palliative care for goals of care.   DVT prophylaxis: scd Code Status: Full Family Communication:  Updated patient and wife at bedside. Disposition Plan: Home once volume overload has resolved and patient no longer hypoxic, acute renal failure has resolved and per nephrology and cardiology.   Consultants:   Nephrology Dr Jonnie Finner: 10/07/2016  Cardiology: Dr McDowell/Dr  Haroldine Laws 2/11-07/2017  Procedures:   Chest x-ray 10/06/2016  2-D echo  10/07/2016  Renal ultrasound 10/07/2016  PICC line 10/09/2016  Antimicrobials:   None   Subjective: Patient states no significant change in shortness of breath. Patient denies any chest pain. No nausea. No vomiting. Patient sitting up at the side of the bed eating breakfast.  Objective: Vitals:   10/08/16 1740 10/08/16 2043 10/09/16 0425 10/09/16 1022  BP: (!) 112/58 (!) 112/49 111/83 (!) 117/56  Pulse: 89 78 83 78  Resp: 18 16 16 16   Temp: 98.9 F (37.2 C) 98.3 F (36.8 C) 98.2 F (36.8 C) 97.5 F (36.4 C)  TempSrc: Oral Oral Oral Oral  SpO2: 100% 100% 97% 98%  Weight:  (!) 142 kg (313 lb 0.9 oz)    Height:        Intake/Output Summary (Last 24 hours) at 10/09/16 1156 Last data filed at 10/09/16 0900  Gross per 24 hour  Intake              930 ml  Output             3400 ml  Net            -2470 ml   Filed Weights   10/07/16 2248 10/08/16 1700 10/08/16 2043  Weight: (!) 142.7 kg (314 lb 8 oz) (!) 141.8 kg (312 lb 9.8 oz) (!) 142 kg (313 lb 0.9 oz)    Examination:  General exam: Somewhat uncomfortable. Respiratory system: Bibasilar crackles. Coarse/Rhonchorus BS. Respiratory effort normal. Cardiovascular system: S1 & S2 heard, RRR. + JVD, murmurs, rubs, gallops or clicks. 3+ bilateral lower extremity edema.  Gastrointestinal system: Abdomen is distended, tight and nontender. No organomegaly or masses felt. Normal bowel sounds heard. Central nervous system: Alert and oriented. No focal neurological deficits. Extremities: Symmetric 5 x 5 power.2- 3+ bilateral lower extremity edema. Skin: Erythema bilateral anterior lower extremities on the shins.  Psychiatry: Judgement and insight appear normal. Mood & affect appropriate.     Data Reviewed: I have personally reviewed following labs and imaging studies  CBC:  Recent Labs Lab 10/06/16 2101 10/07/16 1300 10/08/16 0147 10/09/16 0452    WBC 3.8* 4.0 4.3 3.2*  NEUTROABS 2.5 2.9 3.1  --   HGB 7.1* 7.5* 7.6* 7.3*  HCT 22.7* 23.4* 23.7* 22.6*  MCV 95.8 95.1 93.7 93.8  PLT 69* 71* 82* 80*   Basic Metabolic Panel:  Recent Labs Lab 10/06/16 2101 10/07/16 1300 10/08/16 0147 10/08/16 1212 10/09/16 0452  NA 127* 129* 128* 129* 128*  K 5.8* 5.2* 5.5* 5.0 4.8  CL 91* 96* 92* 95* 91*  CO2 23 23 23 23 24   GLUCOSE 116* 124* 102* 111* 91  BUN 34* 36* 40* 42* 45*  CREATININE 3.28* 3.28* 3.58* 3.61* 3.70*  CALCIUM 7.7* 7.4* 7.7* 7.6* 7.4*  MG 2.2 2.3 2.3  --   --   PHOS 4.9* 4.9* 5.3*  --  5.6*   GFR: Estimated Creatinine Clearance: 29.5 mL/min (by C-G formula based on SCr of 3.7 mg/dL (H)). Liver Function Tests:  Recent Labs Lab 10/06/16 2101 10/07/16 1300 10/08/16 0147 10/09/16 0452  AST 19  --   --   --   ALT 14*  --   --   --  ALKPHOS 85  --   --   --   BILITOT 1.2  --   --   --   PROT 11.9*  --   --   --   ALBUMIN 1.4* 1.4* 1.4* 1.5*   No results for input(s): LIPASE, AMYLASE in the last 168 hours. No results for input(s): AMMONIA in the last 168 hours. Coagulation Profile:  Recent Labs Lab 10/06/16 2101  INR 1.97   Cardiac Enzymes:  Recent Labs Lab 10/06/16 2101 10/07/16 0455 10/07/16 1300  TROPONINI <0.03 <0.03 <0.03   BNP (last 3 results) No results for input(s): PROBNP in the last 8760 hours. HbA1C: No results for input(s): HGBA1C in the last 72 hours. CBG:  Recent Labs Lab 10/06/16 2012 10/07/16 0809 10/09/16 0827  GLUCAP 97 83 94   Lipid Profile: No results for input(s): CHOL, HDL, LDLCALC, TRIG, CHOLHDL, LDLDIRECT in the last 72 hours. Thyroid Function Tests: No results for input(s): TSH, T4TOTAL, FREET4, T3FREE, THYROIDAB in the last 72 hours. Anemia Panel:  Recent Labs  10/09/16 0452  VITAMINB12 320  FOLATE 10.4  FERRITIN 186  TIBC 181*  IRON 79   Sepsis Labs: No results for input(s): PROCALCITON, LATICACIDVEN in the last 168 hours.  Recent Results (from the  past 240 hour(s))  Urine culture     Status: None   Collection Time: 10/07/16 10:10 PM  Result Value Ref Range Status   Specimen Description URINE, RANDOM  Final   Special Requests ADDED 0523 10/08/16  Final   Culture NO GROWTH  Final   Report Status 10/09/2016 FINAL  Final         Radiology Studies: US Renal  Result Date: 10/07/2016 CLINICAL DATA:  Acute renal failure EXAM: RENAL / URINARY TRACT ULTRASOUND COMPLETE COMPARISON:  None. FINDINGS: Right Kidney: Length: 12.2 cm. Echogenicity within normal limits. No mass or hydronephrosis visualized. Left Kidney: Length: 12.7 cm. Echogenicity within normal limits. No mass or hydronephrosis visualized. Bladder: The bladder is decompressed with a Foley catheter. Ascites is seen in the abdomen. IMPRESSION: 1. Limited views of the kidneys due to patient body habitus. No acute abnormality. 2. Ascites. Electronically Signed   By: Dorise Bullion III M.D   On: 10/07/2016 21:29        Scheduled Meds: . calcium acetate  667 mg Oral TID WC  . cyanocobalamin  1,000 mcg Intramuscular Daily  . furosemide  160 mg Intravenous Q6H  . mouth rinse  15 mL Mouth Rinse BID  . metolazone  10 mg Oral BID  . sodium chloride flush  10-40 mL Intracatheter Q12H  . sodium chloride flush  3 mL Intravenous Q12H   Continuous Infusions: . DOPamine       LOS: 3 days    Time spent: 50 minutes    THOMPSON,DANIEL, MD [facility] Pager [phone]  If 7PM-7AM, please contact night-coverage [email or web address] Password [facility] 10/09/2016, 11:56 AM

## 2016-10-09 NOTE — Progress Notes (Signed)
Palliative Medicine consult noted. Due to high referral volume, there may be a delay seeing this patient. Please call the Palliative Medicine Team office at [phone] if recommendations are needed in the interim.  Thank you for inviting [place] to see this patient.   Marjie Skiff Tracey Hermance, RN, BSN, [facility] 10/09/2016 2:49 PM Cell [phone] 8:00-4:00 Monday-Friday Office [phone]

## 2016-10-09 NOTE — Progress Notes (Signed)
[id number] in process of still being cleaned. Call to [facility] RN to give report on patient.

## 2016-10-09 NOTE — Progress Notes (Signed)
Advanced Heart Failure Rounding Note   Subjective:   61 y/o male with apparent longstanding multiple myeloma (per Dr. Wendy Poet note in [place]) now with severe biventricular HF,multiple myeloma,  renal failure and massive volume overload (R> L HF) not responding to high-dose diuretics.   Dr Vaughan Browner reviewed ECHO-->  LVH on echo, low volts on ECG and marked protein gap of 10.5 g/dl he has AL cardiac amyloidosis until proven otherwise.   Diuresing with IV lasix. Sluggish urine output. Weight trending up.   Complaining of increased dyspnea.     Objective:   Weight Range:  Vital Signs:   Temp:  [97.5 F (36.4 C)-98.9 F (37.2 C)] 98.2 F (36.8 C) (02/12 0425) Pulse Rate:  [78-89] 83 (02/12 0425) Resp:  [16-18] 16 (02/12 0425) BP: (111-123)/(49-83) 111/83 (02/12 0425) SpO2:  [97 %-100 %] 97 % (02/12 0425) Weight:  [312 lb 9.8 oz (141.8 kg)-313 lb 0.9 oz (142 kg)] 313 lb 0.9 oz (142 kg) (02/11 2043) Last BM Date: 10/07/16  Weight change: Filed Weights   10/07/16 2248 10/08/16 1700 10/08/16 2043  Weight: (!) 314 lb 8 oz (142.7 kg) (!) 312 lb 9.8 oz (141.8 kg) (!) 313 lb 0.9 oz (142 kg)    Intake/Output:   Intake/Output Summary (Last 24 hours) at 10/09/16 0711 Last data filed at 10/09/16 0600  Gross per 24 hour  Intake              810 ml  Output             3400 ml  Net            -2590 ml     Physical Exam: General: Dyspneic talking. Sitting on the side of the bed.  HEENT: normal R periorbital eccyhmosis Neck: supple. Thick neck, JVP to ear . Carotids 2+ bilat; no bruits. No lymphadenopathy or thryomegaly appreciated. Cor: PMI nondisplaced. Regular rate & rhythm. No rubs, gallops 2/6 TR Lungs: Decreased throughout on 2 liters [place] oxygen.  Abdomen: obese, soft, nontender, +++ distended. No hepatosplenomegaly. No bruits or masses. Good bowel sounds. Extremities: no cyanosis, clubbing, rash, R and LLE 3+ edema R and LLE erythema noted anterior aspect.    Neuro: alert & orientedx3, cranial nerves grossly intact. moves all 4 extremities w/o difficulty. Affect pleasant  Telemetry: NSR 70-80s   Labs: Basic Metabolic Panel:  Recent Labs Lab 10/06/16 2101 10/07/16 1300 10/08/16 0147 10/08/16 1212 10/09/16 0452  NA 127* 129* 128* 129* 128*  K 5.8* 5.2* 5.5* 5.0 4.8  CL 91* 96* 92* 95* 91*  CO2 _0 GLUCOSE 116* 124* 102* 111* 91  BUN 34* 36* 40* 42* 45*  CREATININE 3.28* 3.28* 3.58* 3.61* 3.70*  CALCIUM 7.7* 7.4* 7.7* 7.6* 7.4*  MG 2.2 2.3 2.3  --   --   PHOS 4.9* 4.9* 5.3*  --  5.6*    Liver Function Tests:  Recent Labs Lab 10/06/16 2101 10/07/16 1300 10/08/16 0147 10/09/16 0452  AST 19  --   --   --   ALT 14*  --   --   --   ALKPHOS 85  --   --   --   BILITOT 1.2  --   --   --   PROT 11.9*  --   --   --   ALBUMIN 1.4* 1.4* 1.4* 1.5*   No results for input(s): LIPASE, AMYLASE in the last 168 hours. No results for input(s): AMMONIA in  the last 168 hours.  CBC:  Recent Labs Lab 10/06/16 2101 10/07/16 1300 10/08/16 0147 10/09/16 0452  WBC 3.8* 4.0 4.3 3.2*  NEUTROABS 2.5 2.9 3.1  --   HGB 7.1* 7.5* 7.6* 7.3*  HCT 22.7* 23.4* 23.7* 22.6*  MCV 95.8 95.1 93.7 93.8  PLT 69* 71* 82* 80*    Cardiac Enzymes:  Recent Labs Lab 10/06/16 2101 10/07/16 0455 10/07/16 1300  TROPONINI <0.03 <0.03 <0.03    BNP: BNP (last 3 results)  Recent Labs  10/06/16 2101  BNP 1,535.9*    ProBNP (last 3 results) No results for input(s): PROBNP in the last 8760 hours.    Other results:  Imaging: US Renal  Result Date: 10/07/2016 CLINICAL DATA:  Acute renal failure EXAM: RENAL / URINARY TRACT ULTRASOUND COMPLETE COMPARISON:  None. FINDINGS: Right Kidney: Length: 12.2 cm. Echogenicity within normal limits. No mass or hydronephrosis visualized. Left Kidney: Length: 12.7 cm. Echogenicity within normal limits. No mass or hydronephrosis visualized. Bladder: The bladder is decompressed with a Foley catheter.  Ascites is seen in the abdomen. IMPRESSION: 1. Limited views of the kidneys due to patient body habitus. No acute abnormality. 2. Ascites. Electronically Signed   By: Dorise Bullion III M.D   On: 10/07/2016 21:29      Medications:     Scheduled Medications: . calcium acetate  667 mg Oral TID WC  . furosemide  160 mg Intravenous Q6H  . mouth rinse  15 mL Mouth Rinse BID  . metolazone  10 mg Oral BID  . sodium chloride flush  3 mL Intravenous Q12H     Infusions:   PRN Medications:  acetaminophen **OR** acetaminophen, calcium carbonate (dosed in mg elemental calcium), camphor-menthol **AND** hydrOXYzine, docusate sodium, feeding supplement (NEPRO CARB STEADY), ipratropium-albuterol, ondansetron **OR** ondansetron (ZOFRAN) IV, sorbitol, zolpidem   Assessment:  1. Acute Biventricular Heart Failure 2. Acute on chronic Renal Failure 3. Anemia 4. Multiple Myeloma 5. Hyponatremia   Plan/Discussion:   61 y/o male with apparent longstanding multiple myeloma (per Dr. Wendy Poet note in [place]) now with severe biventricular HF, renal failure and massive volume overload (R> L HF) not responding to high-dose diuretics.   Concern for Amyloid--> AL. Unable to obtain cMRI with elevated creatinine. Check SPEP Creatinine continues to rise.   Considering dopamine versus milrinone. Will discuss with further with attending, Dr Vaughan Browner  Will need Palliative Care for [place].  Length of Stay: 3   Amy Clegg NP-C  10/09/2016, 7:11 AM  Advanced Heart Failure Team Pager [phone] (M-F; 7a - 4p)  Please contact [facility] for night-coverage after hours (4p -7a ) and weekends on [email or web address]  Patient seen and examined with Darrick Grinder, NP. We discussed all aspects of the encounter. I agree with the assessment and plan as stated above.   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 Urine output starting to pick-up on high-dose lasix. Will transfer  to SDU. Place PICC and start dopamine. Will follow CVP and co-ox and renal function carefully.  Long talk about prognosis surrounding cardiac amyloidosis. (about 50% mortality over 4-6 months). Will likely need to involve Palliative Care team soon.    D/w Dr. Joelyn Oms in Renal.  Trigo Winterbottom,MD 4:48 PM

## 2016-10-09 NOTE — Progress Notes (Signed)
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 calcium acetate  667 mg Oral TID WC  . cyanocobalamin  1,000 mcg Intramuscular Daily  . furosemide  160 mg Intravenous Q6H  . mouth rinse  15 mL Mouth Rinse BID  . metolazone  10 mg Oral BID  . sodium chloride flush  10-40 mL Intracatheter Q12H  . sodium chloride flush  3 mL Intravenous Q12H   Continuous Infusions: . DOPamine     PRN Meds:.acetaminophen **OR** acetaminophen, calcium carbonate (dosed in mg elemental calcium), camphor-menthol **AND** hydrOXYzine, docusate sodium, feeding supplement (NEPRO CARB STEADY), ipratropium-albuterol, ondansetron **OR** ondansetron (ZOFRAN) IV, sodium chloride flush, sorbitol, zolpidem  Current Labs: reviewed    Physical Exam:  Blood pressure (!) 117/56, pulse 78, temperature 97.5 F (36.4 C), temperature source Oral, resp. rate 16, height 5\' 10"  (1.778 m), weight (!) 142 kg (313 lb 0.9 oz), SpO2 98 %. Obese, NAD Massive edema/anasarca Chronic venous stasis changes RRR CTAB Protuberant, limited abd exam AAO x3 Nonfocal  A 1. AKI; renal US normal sized kidneys w/o obstruction 2/10 2. Myeloma, recent start CTX Dr. Roni Bread 3. 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 Morbid obesity 5. HTN including low dose ACEi at home; held 6. Hypervolemia  P 1. Check sFLC; SPEP pending 2. Cont diuretics, inotrope 3. I don't think TPE given his comorbidities and uncertainty of primary  cause of renal failuer is warranted here 4. Might need RRT / UF, follow medical management for now 5. Broached goals of care; worrisome outlook   Pearson Grippe MD 10/09/2016, 11:34 AM   Recent Labs Lab 10/07/16 1300 10/08/16 0147 10/08/16 1212 10/09/16 0452  NA 129* 128* 129* 128*  K 5.2* 5.5* 5.0 4.8  CL 96* 92* 95* 91*  CO2 23 23 23 24   GLUCOSE 124* 102* 111* 91  BUN 36* 40* 42* 45*  CREATININE 3.28* 3.58* 3.61* 3.70*  CALCIUM 7.4* 7.7* 7.6* 7.4*  PHOS 4.9* 5.3*  --  5.6*    Recent Labs Lab 10/06/16 2101 10/07/16 1300 10/08/16 0147 10/09/16 0452  WBC 3.8* 4.0 4.3 3.2*  NEUTROABS 2.5 2.9 3.1  --   HGB 7.1* 7.5* 7.6* 7.3*  HCT 22.7* 23.4* 23.7* 22.6*  MCV 95.8 95.1 93.7 93.8  PLT 69* 71* 82* 80*

## 2016-10-09 NOTE — Progress Notes (Signed)
Peripherally Inserted Central Catheter/Midline Placement  The IV Nurse has discussed with the patient and/or persons authorized to consent for the patient, the purpose of this procedure and the potential benefits and risks involved with this procedure.  The benefits include less needle sticks, lab draws from the catheter, and the patient may be discharged home with the catheter. Risks include, but not limited to, infection, bleeding, blood clot (thrombus formation), and puncture of an artery; nerve damage and irregular heartbeat and possibility to perform a PICC exchange if needed/ordered by physician.  Alternatives to this procedure were also discussed.  Bard Power PICC patient education guide, fact sheet on infection prevention and patient information card has been provided to patient /or left at bedside.    PICC/Midline Placement Documentation        Lance [name] 10/09/2016, 9:39 AM

## 2016-10-10 DIAGNOSIS — J9601 Acute respiratory failure with hypoxia: Secondary | ICD-10-CM

## 2016-10-10 DIAGNOSIS — Z7189 Other specified counseling: Secondary | ICD-10-CM

## 2016-10-10 DIAGNOSIS — Z515 Encounter for palliative care: Secondary | ICD-10-CM

## 2016-10-10 LAB — COOXEMETRY PANEL
Carboxyhemoglobin: 1.4 % (ref 0.5–1.5)
Methemoglobin: 1.2 % (ref 0.0–1.5)
O2 SAT: 66 %
Total hemoglobin: 7.4 g/dL — ABNORMAL LOW (ref 12.0–16.0)

## 2016-10-10 LAB — CBC
HEMATOCRIT: 21.6 % — AB (ref 39.0–52.0)
Hemoglobin: 7 g/dL — ABNORMAL LOW (ref 13.0–17.0)
MCH: 29.9 pg (ref 26.0–34.0)
MCHC: 32.4 g/dL (ref 30.0–36.0)
MCV: 92.3 fL (ref 78.0–100.0)
Platelets: 80 10*3/uL — ABNORMAL LOW (ref 150–400)
RBC: 2.34 MIL/uL — ABNORMAL LOW (ref 4.22–5.81)
RDW: 20.7 % — AB (ref 11.5–15.5)
WBC: 3.8 10*3/uL — AB (ref 4.0–10.5)

## 2016-10-10 LAB — PREPARE RBC (CROSSMATCH)

## 2016-10-10 LAB — RENAL FUNCTION PANEL
ALBUMIN: 1.5 g/dL — AB (ref 3.5–5.0)
ANION GAP: 13 (ref 5–15)
BUN: 50 mg/dL — ABNORMAL HIGH (ref 6–20)
CALCIUM: 7.3 mg/dL — AB (ref 8.9–10.3)
CO2: 23 mmol/L (ref 22–32)
CREATININE: 3.64 mg/dL — AB (ref 0.61–1.24)
Chloride: 89 mmol/L — ABNORMAL LOW (ref 101–111)
GFR calc non Af Amer: 16 mL/min — ABNORMAL LOW (ref >60)
GFR, EST AFRICAN AMERICAN: 19 mL/min — AB (ref >60)
GLUCOSE: 112 mg/dL — AB (ref 65–99)
PHOSPHORUS: 6.3 mg/dL — AB (ref 2.5–4.6)
Potassium: 4.4 mmol/L (ref 3.5–5.1)
SODIUM: 125 mmol/L — AB (ref 135–145)

## 2016-10-10 LAB — ABO/RH: ABO/RH(D): A POS

## 2016-10-10 LAB — HEMOGLOBIN AND HEMATOCRIT, BLOOD
HEMATOCRIT: 25 % — AB (ref 39.0–52.0)
HEMOGLOBIN: 8.3 g/dL — AB (ref 13.0–17.0)

## 2016-10-10 LAB — KAPPA/LAMBDA LIGHT CHAINS
KAPPA, LAMDA LIGHT CHAIN RATIO: 791.03 — AB (ref 0.26–1.65)
Kappa free light chain: 2689.5 mg/L — ABNORMAL HIGH (ref 3.3–19.4)
LAMDA FREE LIGHT CHAINS: 3.4 mg/L — AB (ref 5.7–26.3)

## 2016-10-10 LAB — MAGNESIUM: MAGNESIUM: 2.1 mg/dL (ref 1.7–2.4)

## 2016-10-10 LAB — PATHOLOGIST SMEAR REVIEW

## 2016-10-10 MED ORDER — HYDROCODONE-ACETAMINOPHEN 5-325 MG PO TABS: 1.0000 | ORAL_TABLET | ORAL | Status: DC | PRN

## 2016-10-10 MED ORDER — OXYCODONE HCL 5 MG PO TABS
5.0000 mg | ORAL_TABLET | ORAL | Status: DC | PRN
  Administered 2016-10-11 – 2016-10-14 (×5): 5 mg via ORAL
  Filled 2016-10-10 (×6): qty 1

## 2016-10-10 MED ORDER — FUROSEMIDE 10 MG/ML IJ SOLN: 40.0000 mg | Freq: Once | INTRAMUSCULAR | Status: DC

## 2016-10-10 MED ORDER — DIPHENHYDRAMINE HCL 25 MG PO CAPS
25.0000 mg | ORAL_CAPSULE | Freq: Once | ORAL | Status: AC
  Administered 2016-10-10: 25 mg via ORAL
  Filled 2016-10-10: qty 1

## 2016-10-10 MED ORDER — ACETAMINOPHEN 325 MG PO TABS
650.0000 mg | ORAL_TABLET | Freq: Once | ORAL | Status: AC
  Administered 2016-10-10: 650 mg via ORAL
  Filled 2016-10-10: qty 2

## 2016-10-10 MED ORDER — OXYCODONE HCL 5 MG PO TABS: 5.0000 mg | ORAL_TABLET | ORAL | Status: DC | PRN

## 2016-10-10 MED ORDER — SODIUM CHLORIDE 0.9 % IV SOLN
Freq: Once | INTRAVENOUS | Status: AC
  Administered 2016-10-10: 14:00:00 via INTRAVENOUS

## 2016-10-10 NOTE — Progress Notes (Signed)
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 sodium chloride   Intravenous Once  . acetaminophen  650 mg Oral Once  . calcium acetate  667 mg Oral TID WC  . cyanocobalamin  1,000 mcg Intramuscular Daily  . diphenhydrAMINE  25 mg Oral Once  . furosemide  160 mg Intravenous Q6H  . furosemide  40 mg Intravenous Once  . mouth rinse  15 mL Mouth Rinse BID  . metolazone  10 mg Oral BID  . sodium chloride flush  10-40 mL Intracatheter Q12H   Continuous Infusions: . DOPamine 2.5 mcg/kg/min (10/10/16 0300)   PRN Meds:.acetaminophen **OR** acetaminophen, calcium carbonate (dosed in mg elemental calcium), camphor-menthol **AND** hydrOXYzine, docusate sodium, feeding supplement (NEPRO CARB STEADY), ipratropium-albuterol, ondansetron **OR** ondansetron (ZOFRAN) IV, sodium chloride flush, sorbitol, zolpidem  Current Labs: reviewed    Physical Exam:  Blood pressure 130/70, pulse 88, temperature 97.6 F (36.4 C), temperature source Oral, resp. rate (!) 21, height 5\' 10"  (1.778 m), weight (!) 139.3 kg (307 lb 3 oz), SpO2 99 %. Obese, NAD Massive edema/anasarca Chronic venous stasis changes RRR CTAB Protuberant, limited abd exam AAO x3 Nonfocal  A 1. AKI; renal US normal sized kidneys w/o obstruction 2/10 2. Myeloma, recent start CTX Dr. Roni Bread 3. 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 Morbid obesity 5. HTN including low dose ACEi at  home; held 6. Hypervolemia 7. Hypervolemic Hyponatremia  P 1. Await sFLC; SPEP pending 2. Cont diuretics, inotrope 3. Cont medical management, no RRT at this poitn 4. Broached goals of care; worrisome outlook; palliative med to see 5. Cont Na and fluid restriction   Pearson Grippe MD 10/10/2016, 11:11 AM   Recent Labs Lab 10/08/16 0147 10/08/16 1212 10/09/16 0452 10/10/16 0450  NA 128* 129* 128* 125*  K 5.5* 5.0 4.8 4.4  CL 92* 95* 91* 89*  CO2 23 23 24 23   GLUCOSE 102* 111* 91 112*  BUN 40* 42* 45* 50*  CREATININE 3.58* 3.61* 3.70* 3.64*  CALCIUM 7.7* 7.6* 7.4* 7.3*  PHOS 5.3*  --  5.6* 6.3*    Recent Labs Lab 10/06/16 2101 10/07/16 1300 10/08/16 0147 10/09/16 0452 10/10/16 0450  WBC 3.8* 4.0 4.3 3.2* 3.8*  NEUTROABS 2.5 2.9 3.1  --   --   HGB 7.1* 7.5* 7.6* 7.3* 7.0*  HCT 22.7* 23.4* 23.7* 22.6* 21.6*  MCV 95.8 95.1 93.7 93.8 92.3  PLT 69* 71* 82* 80* 80*

## 2016-10-10 NOTE — Progress Notes (Signed)
PROGRESS NOTE    Lance [name]  UJW:[id number] DOB: 01-30-1956 DOA: 10/06/2016 PCP: No primary care provider on file.    Brief Narrative:  Lance [name] is a 61 y.o. gentleman with a history of transfusion dependent multiple myeloma, systolic heart failure with EF 35-40% in January 2018, pancytopenia, and HTN who was admitted to [facility] on 10/04/2016 for management of CHF exacerbation.  The patient has required several transfusions in the past 30 days.  He presented with shortness of breath and anasarca.  His home dose of lasix has been 62m daily (he tells me that his dose was going to be increased to 87mpo BID due to his swelling, but that had not happened prior to his admission to [facility]  He was treated with IV lasix 4040mID.  He put out 1.5L of fluid, but his creatinine has been rising.  Today, BUN 30, creatinine 2.9, potassium 6.1, normal bicarb.  His baseline creatinine is 1.  He received kayexalate for the elevated potassium level prior to transfer.  Transfer requested for nephrology consultation.  At [facility] was [address]000.  He had a CT of the abdomen and pelvis without contrast that was negative for hydronephrosis and kidney stones..  Moderate ascites noted.  Chest xray showed cardiomegaly, mild CHF, and small bilateral effusions.  Lower extremity dopplers were negative for DVT.  At time of admission here, he reports intermittent back pain that radiates anteriorly to his midchest.  He has had some light-headeness, but no LOC.  He also reports difficulty urinating due to scrotal swelling.  Patient admitted with anasarca and acute on chronic systolic heart failure with worsening renal function and a history of multiple myeloma currently transfusion dependent. Patient placed on high-dose IV Lasix and Zaroxolyn however no significant diuresis. Nephrology and cardiology consulted. Patient transferred to the unit for inotropic support.    Assessment & Plan:     Principal Problem:   Acute respiratory failure with hypoxia (HCC) Active Problems:   Acute on chronic systolic CHF (congestive heart failure) (HCC)   ARF (acute renal failure) (HCC)   HTN (hypertension)   Anasarca   Hyperkalemia   Hyperphosphatemia   Hypocalcemia   Anemia   Multiple myeloma (HCC)   Chest pain   Cardiac amyloidosis (HCC)   #1 acute respiratory failure with hypoxia secondary to acute on chronic systolic heart failure/cardiomyopathy Secondary to acute on chronic systolic heart failure. Patient presented from outside hospital after presentation with heart failure and worsening renal function. Patient current weight on admission is 143.6 kg. Patient's weight on 09/26/2016 was 132 kg at his cardiologist's office. Cardiac enzymes negative 3. BNP elevated at 1535.9. It is noted per patient's cardiologist note that patient did have an EF of 35-40% which was a recently new discovery. Repeat 2-D echo with a EF of 35%, diffuse hypokinesis moderately dilated right ventricle with severely reduced contraction, elevated CVP. Patient with a urine output of 1550 mL over the past 24 hours and patient is -4.199 L during this hospitalization. Weights today is 139.3 kg which is a 4 kg weight loss from admission.  Patient on high-dose Lasix 160 mg IV every 8 hours and Zaroxolyn 10 mg twice a day and has been started on inotropes of dopamine per cardiology and nephrology recommendations. Patient is seen in consultation by cardiology and the heart failure team who recommended a trial of inotropic support. PICC line has been placed. Patient be transferred to the unit fand started on inotropic support of  dopamine.  Cardiology also concerned about cardiac amyloidosis as patient noted to have LVH on echo, low voltage on EKG and market protein gap of 10.5 g/dL. If patient with no significant diuresis and worsening renal function may need temporary hemodialysis. Strict I's and O's. Daily weights. Cardiology  and nephrology following and appreciate input and recommendations.   #2 acute renal failure Questionable etiology. Likely secondary to multiple myeloma and acute systolic heart failure in the setting of ACE inhibitor. Per care everywhere patient's creatinine was 1.16 on 09/26/2016 and then subsequently 1.87 on 10/02/2016. Patient recently started on lisinopril on 1/30/ 2018 per his cardiologist. Urinalysis on admission with 100 protein, nitrite negative, leukocytes negative, glucose of 50. Renal ultrasound with no signs of hydronephrosis, ascites. No significant change in renal function. Patient on high-dose Lasix 160 mg IV every 8 hours and Zaroxolyn. Urine output of 1550 mL over the past 24 hours. Strict I's and O's. Daily weights. Patient has been seen in consultation by cardiology who are recommending a trial of inotropic support which was started yesterday. Patient currently on dopamine.Nephrology ff and antibiotic If worsening renal function and poor urine output with no significant diuresis may likely need hemodialysis. Nephrology following.   #3 multiple myeloma Patient being followed by oncology in the outpatient setting and has recently required transfusions secondary to severe anemia. Patient noted to have a history of iron deficiency anemia. Transfuse 2 units packed red blood cells with hemoglobin trending down and currently at 7.0. Follow H&H.   #4 anemia Likely secondary to problem #3. Anemia panel consistent with anemia of chronic disease. B-12 levels on the low side at 320. Hemoglobin currently at 7.0. Continue B-12 IM injections. Will transfuse 2 units packed red blood cells. Follow H&H.   #5 anasarca See problem #1.  #6 hypertension Blood pressure currently stable. Continue IV Lasix. ACE inhibitor on hold secondary to acute renal failure.  #7 hyperkalemia Secondary to acute renal failure. Patient received a dose of Kayexalate on admission as well as IV insulin in the amp of D50.  Repeat potassium at 5. 5 on 10/08/2016. Patient on IV Lasix and also given a dose of Kayexalate 45 g by mouth 1 on 10/08/2016 with resolution of hyperkalemia. Potassium today is 4.4.   #8 hypocalcemia/elevated phosphorus level Continue current dose of PhosLo.  #9 chest pain Likely secondary to volume overload. Patient on telemetry. EKG with no ischemic changes noted. Cardiac enzymes negative to date. 2-D echo with EF of 35%, mild LVH, diffuse hypokinesis with no significant change from 2-D echo noted on patient's cardiologist note on recent visit of 09/26/2016. Patient on high-dose IV Lasix as well as Zaroxolyn with minimal urine output. Continue diuresis with high-dose IV Lasix and Zaroxolyn. Patient has been seen in consultation by cardiology who had recommended a trial of inotropic support with dopamine. Patient with a urine output of 1.55 L over the past 24 hours and is -4.199 L during this hospitalization. Patient's current weight is 139.3 kg from 143.6 kg on admission. Follow.  #10 hyponatremia Secondary to volume overload. Follow with diuresis.  #11 prognosis Patient with a history of cardiomyopathy with EF of 35%, history of multiple myeloma with associated anemia and thrombocytopenia with ongoing recent transfusions presented to the hospital with anasarca, acute on chronic systolic heart failure with worsening renal function outside hospital. Patient also noted to have LVH on echo, low voltage and market protein gap of 10.5 g per 10th liter worrisome for cardiac amyloidosis now on high-dose IV  Lasix and Zaroxolyn with no significant improvement with renal function and no significant diuresis. Patient transferred to stepdown unit/ICU for inotropic support per cardiology recommendations. Patient with a poor prognosis. Palliative care consult pending for goals of care.    DVT prophylaxis: scd Code Status: Full Family Communication: Updated patient. No family at bedside. Disposition Plan:  Home once volume overload has resolved and patient no longer hypoxic, acute renal failure has resolved and per nephrology and cardiology.   Consultants:   Nephrology Dr Jonnie Finner: 10/07/2016  Cardiology: Dr McDowell/Dr Haroldine Laws 2/11-07/2017  Palliative care pending  Procedures:   Chest x-ray 10/06/2016  2-D echo  10/07/2016  Renal ultrasound 10/07/2016  PICC line 10/09/2016  2 units packed red blood cells pending 10/10/2016  Antimicrobials:   None   Subjective: Patient states no significant change in shortness of breath. Patient denies any chest pain. No nausea. No vomiting. Patient sitting up in chair.   Objective: Vitals:   10/09/16 1845 10/09/16 2100 10/10/16 0445 10/10/16 0815  BP: 116/64 118/81  130/70  Pulse: 92 92  88  Resp: (!) 23 (!) 22  (!) 21  Temp:  97.9 F (36.6 C)  97.6 F (36.4 C)  TempSrc:  Oral  Oral  SpO2: 96% 97%  99%  Weight:   (!) 139.3 kg (307 lb 3 oz)   Height:        Intake/Output Summary (Last 24 hours) at 10/10/16 1129 Last data filed at 10/10/16 1000  Gross per 24 hour  Intake          1323.04 ml  Output             2350 ml  Net         -1026.96 ml   Filed Weights   10/08/16 1700 10/08/16 2043 10/10/16 0445  Weight: (!) 141.8 kg (312 lb 9.8 oz) (!) 142 kg (313 lb 0.9 oz) (!) 139.3 kg (307 lb 3 oz)    Examination:  General exam: Somewhat uncomfortable. Respiratory system: Bibasilar crackles. Coarse/Rhonchorus BS. Respiratory effort normal. Cardiovascular system: S1 & S2 heard, RRR. + JVD, murmurs, rubs, gallops or clicks. 3+ bilateral lower extremity edema.  Gastrointestinal system: Abdomen is distended, less tight and nontender. No organomegaly or masses felt. Normal bowel sounds heard. Central nervous system: Alert and oriented. No focal neurological deficits. Extremities: Symmetric 5 x 5 power.2- 3+ bilateral lower extremity edema. Skin: Erythema bilateral anterior lower extremities on the shins.  Psychiatry: Judgement and  insight appear normal. Mood & affect appropriate.     Data Reviewed: I have personally reviewed following labs and imaging studies  CBC:  Recent Labs Lab 10/06/16 2101 10/07/16 1300 10/08/16 0147 10/09/16 0452 10/10/16 0450  WBC 3.8* 4.0 4.3 3.2* 3.8*  NEUTROABS 2.5 2.9 3.1  --   --   HGB 7.1* 7.5* 7.6* 7.3* 7.0*  HCT 22.7* 23.4* 23.7* 22.6* 21.6*  MCV 95.8 95.1 93.7 93.8 92.3  PLT 69* 71* 82* 80* 80*   Basic Metabolic Panel:  Recent Labs Lab 10/06/16 2101 10/07/16 1300 10/08/16 0147 10/08/16 1212 10/09/16 0452 10/10/16 0450  NA 127* 129* 128* 129* 128* 125*  K 5.8* 5.2* 5.5* 5.0 4.8 4.4  CL 91* 96* 92* 95* 91* 89*  CO2 23 23 23 23 24 23   GLUCOSE 116* 124* 102* 111* 91 112*  BUN 34* 36* 40* 42* 45* 50*  CREATININE 3.28* 3.28* 3.58* 3.61* 3.70* 3.64*  CALCIUM 7.7* 7.4* 7.7* 7.6* 7.4* 7.3*  MG 2.2 2.3 2.3  --   --  2.1  PHOS 4.9* 4.9* 5.3*  --  5.6* 6.3*   GFR: Estimated Creatinine Clearance: 29.6 mL/min (by C-G formula based on SCr of 3.64 mg/dL (H)). Liver Function Tests:  Recent Labs Lab 10/06/16 2101 10/07/16 1300 10/08/16 0147 10/09/16 0452 10/10/16 0450  AST 19  --   --   --   --   ALT 14*  --   --   --   --   ALKPHOS 85  --   --   --   --   BILITOT 1.2  --   --   --   --   PROT 11.9*  --   --   --   --   ALBUMIN 1.4* 1.4* 1.4* 1.5* 1.5*   No results for input(s): LIPASE, AMYLASE in the last 168 hours. No results for input(s): AMMONIA in the last 168 hours. Coagulation Profile:  Recent Labs Lab 10/06/16 2101  INR 1.97   Cardiac Enzymes:  Recent Labs Lab 10/06/16 2101 10/07/16 0455 10/07/16 1300  TROPONINI <0.03 <0.03 <0.03   BNP (last 3 results) No results for input(s): PROBNP in the last 8760 hours. HbA1C: No results for input(s): HGBA1C in the last 72 hours. CBG:  Recent Labs Lab 10/06/16 2012 10/07/16 0809 10/09/16 0827  GLUCAP 97 83 94   Lipid Profile: No results for input(s): CHOL, HDL, LDLCALC, TRIG, CHOLHDL,  LDLDIRECT in the last 72 hours. Thyroid Function Tests: No results for input(s): TSH, T4TOTAL, FREET4, T3FREE, THYROIDAB in the last 72 hours. Anemia Panel:  Recent Labs  10/09/16 0452  VITAMINB12 320  FOLATE 10.4  FERRITIN 186  TIBC 181*  IRON 79   Sepsis Labs: No results for input(s): PROCALCITON, LATICACIDVEN in the last 168 hours.  Recent Results (from the past 240 hour(s))  Urine culture     Status: None   Collection Time: 10/07/16 10:10 PM  Result Value Ref Range Status   Specimen Description URINE, RANDOM  Final   Special Requests ADDED 0523 10/08/16  Final   Culture NO GROWTH  Final   Report Status 10/09/2016 FINAL  Final  MRSA PCR Screening     Status: None   Collection Time: 10/09/16  1:26 PM  Result Value Ref Range Status   MRSA by PCR NEGATIVE NEGATIVE Final    Comment:        The GeneXpert MRSA Assay (FDA approved for NASAL specimens only), is one component of a comprehensive MRSA colonization surveillance program. It is not intended to diagnose MRSA infection nor to guide or monitor treatment for MRSA infections.          Radiology Studies: No results found.      Scheduled Meds: . sodium chloride   Intravenous Once  . acetaminophen  650 mg Oral Once  . calcium acetate  667 mg Oral TID WC  . cyanocobalamin  1,000 mcg Intramuscular Daily  . diphenhydrAMINE  25 mg Oral Once  . furosemide  160 mg Intravenous Q6H  . furosemide  40 mg Intravenous Once  . mouth rinse  15 mL Mouth Rinse BID  . metolazone  10 mg Oral BID  . sodium chloride flush  10-40 mL Intracatheter Q12H   Continuous Infusions: . DOPamine 2.5 mcg/kg/min (10/10/16 0300)     LOS: 4 days    Time spent: 29 minutes    THOMPSON,DANIEL, MD [facility] Pager [phone]  If 7PM-7AM, please contact night-coverage [email or web address] Password [facility] 10/10/2016, 11:29 AM

## 2016-10-10 NOTE — Consult Note (Signed)
Consultation Note Date: 10/10/2016   Patient Name: Lance [name]  DOB: [birth year]  MRN: [id number]  Age / Sex: 61 y.o., male  PCP: No primary care provider on file. Referring Physician: Eugenie Filler, MD  Reason for Consultation: Establishing goals of care  HPI/Patient Profile: 61 y.o. male  with past medical history of transfusion dependent multiple myeloma, sys HF (EF 35-40%), pancytopenia, hypertension admitted on 10/06/2016 with for CHF exacerbation. He was transferred to [facility] from [place] for nephrology consult d/t AKI in setting of sever CHF and volume overload not responding to high-dose diuretics.  Palliative medicine consulted for [place].  Clinical Assessment and Goals of Care: Met with patient at bedside. Discussed his illness state and prognosis. Introduced palliative medicine and discussed [place]. He has a living will and was previously DNR code status, but he rescinded this when he was at [facility]. He feels a responsibility to his younger sister who he has been helping financially and emotionally. I presented the true nature of a full code to him and the fact that recovery for someone in his state would not be likely and his sister and brother would be left with the burden of removing him from life support should he die a natural death and be brought back.  During our conversation he did note he is tired and would not mind "to just go on". Discussed that this is an option- to deescalate care and focus on comfort. He is reluctant to change his code status or deescalate care due to the responsibility he feels towards his brother and sister. He is the oldest of four and has always taken care of them, especially after the death of their parents. I recommended a follow up meeting with the support of his family present and he agreed to this. He is also having significant pain in his left  side and back. Pain is severe, intermittent and he describes it as "a balloon inflating inside me".    Primary Decision Maker PATIENT    SUMMARY OF RECOMMENDATIONS -Full scope care for now -Followup meeting planned with family and patient for tomorrow at 11am -Oxycodone 47m po q4hr prn for pain and SOB- [facility]    Code Status/Advance Care Planning:  Full code    Symptom Management:   As above  Palliative Prophylaxis:   Frequent Pain Assessment  Prognosis:    Unable to determine  Discharge Planning: To Be Determined  Primary Diagnoses: Present on Admission: . Acute on chronic systolic CHF (congestive heart failure) ([place] . ARF (acute renal failure) ([place] . HTN (hypertension) . Anasarca . Acute respiratory failure with hypoxia ([place] . Hyperkalemia . Hyperphosphatemia . Hypocalcemia . Anemia . Multiple myeloma ([place]   I have reviewed the medical record, interviewed the patient and family, and examined the patient. The following aspects are pertinent.  Past Medical History:  Diagnosis Date  . Depression   . Dilated cardiomyopathy ([place]   .  Diverticulosis   . Hypertension   . Iron deficiency anemia   . Multiple myeloma ([place])   . Pancytopenia [facility])    Social History   Social History  . Marital status: Unknown    Spouse name: N/A  . Number of children: N/A  . Years of education: N/A   Social History Main Topics  . Smoking status: Never Smoker  . Smokeless tobacco: Never Used  . Alcohol use No  . Drug use: No  . Sexual activity: Not Asked   Other Topics Concern  . None   Social History Narrative  . None   Family History  Problem Relation Age of Onset  . Hypertension Mother   . Diabetes Mother   . Bone cancer Mother   . Diabetes Father   . Prostate cancer Father   . Lung cancer Sister   . Brain cancer Brother    Scheduled Meds: . calcium acetate  667 mg  Oral TID WC  . cyanocobalamin  1,000 mcg Intramuscular Daily  . furosemide  160 mg Intravenous Q6H  . furosemide  40 mg Intravenous Once  . mouth rinse  15 mL Mouth Rinse BID  . metolazone  10 mg Oral BID  . sodium chloride flush  10-40 mL Intracatheter Q12H   Continuous Infusions: . DOPamine 2.5 mcg/kg/min (10/10/16 0300)   PRN Meds:.acetaminophen **OR** acetaminophen, calcium carbonate (dosed in mg elemental calcium), camphor-menthol **AND** hydrOXYzine, docusate sodium, feeding supplement (NEPRO CARB STEADY), HYDROcodone-acetaminophen, ipratropium-albuterol, ondansetron **OR** ondansetron (ZOFRAN) IV, sodium chloride flush, sorbitol, zolpidem Medications Prior to Admission:  Prior to Admission medications   Medication Sig Start Date End Date Taking? Authorizing Provider  B Complex-C-Folic Acid (RENO CAPS) 1 MG CAPS Take 1 capsule by mouth daily. 08/11/16  Yes Historical Provider, MD  ferrous gluconate (FERGON) 324 MG tablet Take 1 tablet by mouth daily. 08/10/16  Yes Historical Provider, MD  furosemide (LASIX) 40 MG tablet Take 80 mg by mouth daily. 09/29/16  Yes Historical Provider, MD  lisinopril (PRINIVIL,ZESTRIL) 2.5 MG tablet Take 2.5 mg by mouth daily. 09/26/16 10/26/16 Yes Historical Provider, MD  potassium chloride (K-DUR,KLOR-CON) 10 MEQ tablet Take 1 tablet by mouth 2 (two) times daily. 09/26/16 09/26/17 Yes Historical Provider, MD  PROAIR HFA 108 (90 Base) MCG/ACT inhaler Take 1 puff by mouth daily as needed. 07/19/16  Yes Historical Provider, MD   Allergies  Allergen Reactions  . Morphine And Related Other (See Comments)    "makes me craxy - talk out of my head - yell"  . Penicillins     Unknown Has patient had a PCN reaction causing immediate rash, facial/tongue/throat swelling, SOB or lightheadedness with hypotension: YES Has patient had a PCN reaction causing severe rash involving mucus membranes or skin necrosis: NO Has patient had a PCN reaction that required hospitalization  NO Has patient had a PCN reaction occurring within the last 10 years: NO If all of the above answers are "NO", then may proceed with Cephalosporin use.  . Tobacco [Nicotiana Tabacum]    Review of Systems  Constitutional: Positive for fatigue.  Respiratory: Positive for chest tightness.   Cardiovascular: Positive for chest pain and leg swelling.  Gastrointestinal: Positive for abdominal distention and abdominal pain.  Genitourinary: Positive for difficulty urinating and flank pain.  Musculoskeletal: Positive for back pain.  All other systems reviewed and are negative.   Physical Exam  Constitutional: He is oriented to person, place, and time. No distress.  HENT:  Head: Normocephalic and atraumatic.  Cardiovascular:  Diffuse anasarca  Pulmonary/Chest: He has wheezes.  tachypneic  Abdominal: He exhibits distension. There is tenderness.  Musculoskeletal: He exhibits edema.  Neurological: He is alert and oriented to person, place, and time.  Skin:  BLE red  Psychiatric: He has a normal mood and affect. His behavior is normal.    Vital Signs: BP 124/63   Pulse 86   Temp 97.5 F (36.4 C) (Oral)   Resp (!) 24   Ht _0  (1.778 m)   Wt (!) 139.3 kg (307 lb 3 oz)   SpO2 96%   BMI 44.08 kg/m  Pain Assessment: No/denies pain   Pain Score: 0-No pain   SpO2: SpO2: 96 % O2 Device:SpO2: 96 % O2 Flow Rate: .O2 Flow Rate (L/min): 3 L/min  IO: Intake/output summary:  Intake/Output Summary (Last 24 hours) at 10/10/16 1455 Last data filed at 10/10/16 1331  Gross per 24 hour  Intake          2399.04 ml  Output             1450 ml  Net           949.04 ml    LBM: Last BM Date: 10/09/16 Baseline Weight: Weight: (!) 143.6 kg (316 lb 9.3 oz) Most recent weight: Weight: (!) 139.3 kg (307 lb 3 oz)     Palliative Assessment/Data: PPS: 50%   Flowsheet Rows   Flowsheet Row Most Recent Value  Intake Tab  Referral Department  Hospitalist  Unit at Time of Referral  Med/Surg Unit    Palliative Care Primary Diagnosis  Cancer  Date Notified  10/09/16  Palliative Care Type  New Palliative care  Reason for referral  Clarify Goals of Care  Date of Admission  10/06/16  # of days IP prior to Palliative referral  3  Clinical Assessment  Psychosocial & Spiritual Assessment  Palliative Care Outcomes      Thank you for this consult. Palliative medicine will continue to follow and assist as needed.   Time Total: 80 minutes Greater than 50%  of this time was spent counseling and coordinating care related to the above assessment and plan.  Signed by: Mariana Kaufman, AGNP-C Palliative Medicine    Please contact Palliative Medicine Team phone at [phone] for questions and concerns.  For individual provider: See Shea Evans

## 2016-10-10 NOTE — Progress Notes (Signed)
Advanced Heart Failure Rounding Note   Subjective:   61 y/o male with apparent longstanding multiple myeloma (per Dr. Wendy Poet note in [place]) now with severe biventricular HF,multiple myeloma,  renal failure and massive volume overload (R> L HF) not responding to high-dose diuretics.   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 Weight down 6 pounds.  Creatinine 3.7>3.6   SOB at rest.  CVP 25. Co-ox 58% yesterday on dopa 2.5. Weight down 6 pounds. Hgb 7.0    Objective:   Weight Range:  Vital Signs:   Temp:  [97.5 F (36.4 C)-97.9 F (36.6 C)] 97.9 F (36.6 C) (02/12 2100) Pulse Rate:  [78-92] 92 (02/12 2100) Resp:  [16-24] 22 (02/12 2100) BP: (116-132)/(56-81) 118/81 (02/12 2100) SpO2:  [96 %-100 %] 97 % (02/12 2100) Weight:  [307 lb 3 oz (139.3 kg)] 307 lb 3 oz (139.3 kg) (02/13 0445) Last BM Date: 10/09/16  Weight change: Filed Weights   10/08/16 1700 10/08/16 2043 10/10/16 0445  Weight: (!) 312 lb 9.8 oz (141.8 kg) (!) 313 lb 0.9 oz (142 kg) (!) 307 lb 3 oz (139.3 kg)    Intake/Output:   Intake/Output Summary (Last 24 hours) at 10/10/16 0744 Last data filed at 10/10/16 0300  Gross per 24 hour  Intake          1470.24 ml  Output             1550 ml  Net           -79.76 ml     Physical Exam: CVP 25  General: Dyspneic talking. Sitting in the chair.   HEENT: normal R periorbital eccyhmosis Neck: supple. Thick neck, JVP to ear . Carotids 2+ bilat; no bruits. No lymphadenopathy or thryomegaly appreciated. Cor: PMI nondisplaced. Regular rate & rhythm. No rubs, gallops 2/6 TR Lungs: Decreased throughout on 2 liters [place] oxygen.  Abdomen: obese, soft, nontender, +++ distended. No hepatosplenomegaly. No bruits or masses. Good bowel sounds. Extremities: no cyanosis, clubbing, rash, R and LLE 3+ edema R and LLE erythema noted anterior  aspect.  Neuro: alert & orientedx3, cranial nerves grossly intact. moves all 4 extremities w/o difficulty. Affect pleasant  Telemetry: NSR 80-90s   Labs: Basic Metabolic Panel:  Recent Labs Lab 10/06/16 2101 10/07/16 1300 10/08/16 0147 10/08/16 1212 10/09/16 0452 10/10/16 0450  NA 127* 129* 128* 129* 128* 125*  K 5.8* 5.2* 5.5* 5.0 4.8 4.4  CL 91* 96* 92* 95* 91* 89*  CO2 23 23 23 23 24 23   GLUCOSE 116* 124* 102* 111* 91 112*  BUN 34* 36* 40* 42* 45* 50*  CREATININE 3.28* 3.28* 3.58* 3.61* 3.70* 3.64*  CALCIUM 7.7* 7.4* 7.7* 7.6* 7.4* 7.3*  MG 2.2 2.3 2.3  --   --  2.1  PHOS 4.9* 4.9* 5.3*  --  5.6* 6.3*    Liver Function Tests:  Recent Labs Lab 10/06/16 2101 10/07/16 1300 10/08/16 0147 10/09/16 0452 10/10/16 0450  AST 19  --   --   --   --   ALT 14*  --   --   --   --   ALKPHOS 85  --   --   --   --   BILITOT 1.2  --   --   --   --   PROT 11.9*  --   --   --   --  ALBUMIN 1.4* 1.4* 1.4* 1.5* 1.5*   No results for input(s): LIPASE, AMYLASE in the last 168 hours. No results for input(s): AMMONIA in the last 168 hours.  CBC:  Recent Labs Lab 10/06/16 2101 10/07/16 1300 10/08/16 0147 10/09/16 0452 10/10/16 0450  WBC 3.8* 4.0 4.3 3.2* 3.8*  NEUTROABS 2.5 2.9 3.1  --   --   HGB 7.1* 7.5* 7.6* 7.3* 7.0*  HCT 22.7* 23.4* 23.7* 22.6* 21.6*  MCV 95.8 95.1 93.7 93.8 92.3  PLT 69* 71* 82* 80* 80*    Cardiac Enzymes:  Recent Labs Lab 10/06/16 2101 10/07/16 0455 10/07/16 1300  TROPONINI <0.03 <0.03 <0.03    BNP: BNP (last 3 results)  Recent Labs  10/06/16 2101  BNP 1,535.9*    ProBNP (last 3 results) No results for input(s): PROBNP in the last 8760 hours.    Other results:  Imaging: No results found.   Medications:     Scheduled Medications: . calcium acetate  667 mg Oral TID WC  . cyanocobalamin  1,000 mcg Intramuscular Daily  . furosemide  160 mg Intravenous Q6H  . mouth rinse  15 mL Mouth Rinse BID  . metolazone  10 mg Oral  BID  . sodium chloride flush  10-40 mL Intracatheter Q12H    Infusions: . DOPamine 2.5 mcg/kg/min (10/10/16 0300)    PRN Medications: acetaminophen **OR** acetaminophen, calcium carbonate (dosed in mg elemental calcium), camphor-menthol **AND** hydrOXYzine, docusate sodium, feeding supplement (NEPRO CARB STEADY), ipratropium-albuterol, ondansetron **OR** ondansetron (ZOFRAN) IV, sodium chloride flush, sorbitol, zolpidem   Assessment:   1. Acute Biventricular Heart Failure 2. Acute on chronic Renal Failure 3. Anemia 4. Multiple Myeloma 5. Hyponatremia   Plan/Discussion:   61 y/o male with apparent longstanding multiple myeloma (per Dr. Wendy Poet note in [place]) now with severe biventricular HF, renal failure and massive volume overload (R> L HF) not responding to high-dose diuretics.   Concern for Amyloid--> AL. Unable to obtain cMRI with elevated creatinine. Check SPEP Creatinine continues to rise.   Marked volume overload. CVP 25. Creatinine down a little 3.7>3.6 . Continue high dose lasix + dopamine 2.5 mcg. CO-OX pending. SPEP UPEP pending.    Palliative Care for [place].   Length of Stay: 4   Amy Clegg NP-C  10/10/2016, 7:44 AM  Advanced Heart Failure Team Pager [phone] (M-F; 7a - 4p)  Please contact [facility] for night-coverage after hours (4p -7a ) and weekends on [email or web address]  Patient seen and examined with Darrick Grinder, NP. We discussed all aspects of the encounter. I agree with the assessment and plan as stated above.   He has begun to respond to dopamine and high-dose lasix. Remains markedly volume overloaded. CVP 25. Will continue dopamine and lasix. Creatinine stable to slightly improved. Hgb dropping. Likely would benefit from repeat RBC transfusion. Watch for AF with dopamine. I do not think definitive biopsy will add much here int he face of known myleoma.  Agree with Palliative Care discussions.   Yong Grieser,MD 8:45 AM

## 2016-10-11 DIAGNOSIS — D699 Hemorrhagic condition, unspecified: Secondary | ICD-10-CM

## 2016-10-11 DIAGNOSIS — C9000 Multiple myeloma not having achieved remission: Secondary | ICD-10-CM

## 2016-10-11 DIAGNOSIS — D689 Coagulation defect, unspecified: Secondary | ICD-10-CM

## 2016-10-11 DIAGNOSIS — D61818 Other pancytopenia: Secondary | ICD-10-CM

## 2016-10-11 DIAGNOSIS — I5082 Biventricular heart failure: Secondary | ICD-10-CM

## 2016-10-11 LAB — COOXEMETRY PANEL
Carboxyhemoglobin: 1.8 % — ABNORMAL HIGH (ref 0.5–1.5)
METHEMOGLOBIN: 1.2 % (ref 0.0–1.5)
O2 Saturation: 75.2 %
Total hemoglobin: 8.9 g/dL — ABNORMAL LOW (ref 12.0–16.0)

## 2016-10-11 LAB — RENAL FUNCTION PANEL
Albumin: 1.6 g/dL — ABNORMAL LOW (ref 3.5–5.0)
Anion gap: 16 — ABNORMAL HIGH (ref 5–15)
BUN: 54 mg/dL — AB (ref 6–20)
CHLORIDE: 85 mmol/L — AB (ref 101–111)
CO2: 25 mmol/L (ref 22–32)
Calcium: 7.9 mg/dL — ABNORMAL LOW (ref 8.9–10.3)
Creatinine, Ser: 3.64 mg/dL — ABNORMAL HIGH (ref 0.61–1.24)
GFR calc Af Amer: 19 mL/min — ABNORMAL LOW (ref >60)
GFR calc non Af Amer: 16 mL/min — ABNORMAL LOW (ref >60)
GLUCOSE: 95 mg/dL (ref 65–99)
POTASSIUM: 4.4 mmol/L (ref 3.5–5.1)
Phosphorus: 5.6 mg/dL — ABNORMAL HIGH (ref 2.5–4.6)
Sodium: 126 mmol/L — ABNORMAL LOW (ref 135–145)

## 2016-10-11 LAB — TYPE AND SCREEN
ABO/RH(D): A POS
Antibody Screen: NEGATIVE
Unit division: 0
Unit division: 0

## 2016-10-11 LAB — CBC
HCT: 25.9 % — ABNORMAL LOW (ref 39.0–52.0)
HEMOGLOBIN: 8.6 g/dL — AB (ref 13.0–17.0)
MCH: 30.2 pg (ref 26.0–34.0)
MCHC: 33.2 g/dL (ref 30.0–36.0)
MCV: 90.9 fL (ref 78.0–100.0)
Platelets: 74 10*3/uL — ABNORMAL LOW (ref 150–400)
RBC: 2.85 MIL/uL — AB (ref 4.22–5.81)
RDW: 20.2 % — ABNORMAL HIGH (ref 11.5–15.5)
WBC: 3.6 10*3/uL — AB (ref 4.0–10.5)

## 2016-10-11 LAB — NA AND K (SODIUM & POTASSIUM), RAND UR
POTASSIUM UR: 9 mmol/L
Sodium, Ur: 109 mmol/L

## 2016-10-11 LAB — PROTIME-INR
INR: 1.96
PROTHROMBIN TIME: 22.6 s — AB (ref 11.4–15.2)

## 2016-10-11 LAB — FIBRINOGEN: Fibrinogen: 237 mg/dL (ref 210–475)

## 2016-10-11 LAB — LACTATE DEHYDROGENASE: LDH: 134 U/L (ref 98–192)

## 2016-10-11 LAB — OSMOLALITY, URINE: OSMOLALITY UR: 275 mosm/kg — AB (ref 300–900)

## 2016-10-11 LAB — APTT: aPTT: 73 seconds — ABNORMAL HIGH (ref 24–36)

## 2016-10-11 MED ORDER — DEXAMETHASONE SODIUM PHOSPHATE 4 MG/ML IJ SOLN
20.0000 mg | Freq: Once | INTRAMUSCULAR | Status: DC
  Filled 2016-10-11: qty 5

## 2016-10-11 MED ORDER — DEXAMETHASONE SODIUM PHOSPHATE 10 MG/ML IJ SOLN: 20.0000 mg | Freq: Once | INTRAMUSCULAR | Status: DC

## 2016-10-11 MED ORDER — TRAZODONE HCL 50 MG PO TABS
50.0000 mg | ORAL_TABLET | Freq: Every evening | ORAL | Status: DC | PRN
  Administered 2016-10-12 – 2016-10-15 (×3): 50 mg via ORAL
  Filled 2016-10-11 (×3): qty 1

## 2016-10-11 MED ORDER — BORTEZOMIB CHEMO SQ INJECTION 3.5 MG (2.5MG/ML)
1.5000 mg/m2 | Freq: Once | INTRAMUSCULAR | Status: DC
  Filled 2016-10-11: qty 1.6

## 2016-10-11 NOTE — Consult Note (Addendum)
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 male who has been referred to [place] by Dr Rica Mast Tanna Furry, MD  for evaluation and management of myeloma/possible AL Amyloidosis.  Patient has a history of hypertension and was diagnosed with MGUS in 2011 and with multiple myeloma in 2015 (with marrow showed 15-20% plasma cells at the time with cytogenetics showing trisomy 11). He has been following with Dr. Lavera Guise and was recommended treatment about a year or more ago but had decided not to start treatment at the time due to concerns about time off from work and financial issues.  Recently from November 2017 he has had worsening pancytopenia and has required an excess of 10 units of PRBCs. He has also had significant thrombocytopenia.  Patient notes that he has had significant bruising on his extremities around his right eye and also notes intermittent hematuria and rectal bleeding. Noted to have significantly abnormal coags including an elevated PTT and PT.  He has been admitted with fluid overload due to acute CHF and worsening renal function. In discussing with Dr. Bobby Rumpf it appears that the patient was recently in early Jan 2018 started on RVd . He has not received or started Revlimid. Has received weekly Velcade 3 doses and missed the last weekly dose. Does not recollect if he has been taking his dexamethasone.   Recent echocardiogram on 10/07/2016 shows an ejection fraction of 35% with diffuse hypokinesis. Wall thickness somewhat increased. Diastolic function could not be evaluated. Biventricular failure noted. Patient is currently on a dopamine drip. He is being managed by Dr. Zoila Shutter from a cardiology perspective.  He also appears to be in  significant renal failure with a creatinine of 3.7 and a GFR in the teens. He is being followed and managed by Dr. Joelyn Oms.  His last myeloma labs done here on 10/09/2016 SPEP is pending SFLC Kappa LC 2689.5 , Lambda 3.4 K/L ratio of 791.  We were consulted to weigh in on treatment options and further management. The patient notes that his shortness of breath is improved some with aggressive diuresis but still having significant shortness of breath with minimal exertion.  He understands that his situation is precarious but is open to possible treatment ideas. His myeloma is fairly treatment nave and he was only recently started on Velcade.  He notes that he has not lost his job and wonders about what disability benefits might be available to support himself. We discuss goals of care. He notes that he would like to consider treatment for his multiple myeloma if there is a change that it might respond.   MEDICAL HISTORY:  Past Medical History:  Diagnosis Date  . Depression   . Dilated cardiomyopathy ([place])   . Diverticulosis   . Hypertension   . Iron deficiency anemia   . Multiple myeloma ([place])   . Pancytopenia ([place])     SURGICAL HISTORY: Past Surgical History:  Procedure Laterality Date  . INGUINAL HERNIA REPAIR Right     SOCIAL HISTORY: Social History   Social History  . Marital status: Unknown    Spouse name: N/A  . Number of children: N/A  . Years of education: N/A   Occupational History  . Not on file.   Social History Main Topics  .  Smoking status: Never Smoker  . Smokeless tobacco: Never Used  . Alcohol use No  . Drug use: No  . Sexual activity: Not on file   Other Topics Concern  . Not on file   Social History Narrative  . No narrative on file    FAMILY HISTORY: Family History  Problem Relation Age of Onset  . Hypertension Mother   . Diabetes Mother   . Bone cancer Mother   . Diabetes Father   . Prostate cancer Father   . Lung cancer Sister   .  Brain cancer Brother     ALLERGIES:  is allergic to morphine and related; penicillins; and tobacco [nicotiana tabacum].  MEDICATIONS:  Current Facility-Administered Medications  Medication Dose Route Frequency Provider Last Rate Last Dose  . acetaminophen (TYLENOL) tablet 650 mg  650 mg Oral Q6H PRN Lily Kocher, MD   650 mg at 10/11/16 0044   Or  . acetaminophen (TYLENOL) suppository 650 mg  650 mg Rectal Q6H PRN Lily Kocher, MD      . calcium acetate (PHOSLO) capsule 667 mg  667 mg Oral TID WC Lily Kocher, MD   667 mg at 10/11/16 3903  . calcium carbonate (dosed in mg elemental calcium) suspension 500 mg of elemental calcium  500 mg of elemental calcium Oral Q6H PRN Eugenie Filler, MD      . camphor-menthol [facility]) lotion 1 application  1 application Topical E0P PRN Eugenie Filler, MD       And  . hydrOXYzine (ATARAX/VISTARIL) tablet 25 mg  25 mg Oral Q8H PRN Eugenie Filler, MD      . cyanocobalamin ((VITAMIN B-12)) injection 1,000 mcg  1,000 mcg Intramuscular Daily Eugenie Filler, MD   1,000 mcg at 10/11/16 0839  . docusate sodium (ENEMEEZ) enema 283 mg  1 enema Rectal PRN Eugenie Filler, MD      . DOPamine (INTROPIN) 800 mg in dextrose 5 % 250 mL (3.2 mg/mL) infusion  2.5 mcg/kg/min Intravenous Titrated Amy D Clegg, NP 6.7 mL/hr at 10/11/16 0626 2.5 mcg/kg/min at 10/11/16 0626  . feeding supplement (NEPRO CARB STEADY) liquid 237 mL  237 mL Oral TID PRN Eugenie Filler, MD      . furosemide (LASIX) 160 mg in dextrose 5 % 50 mL IVPB  160 mg Intravenous Q6H [name], MD   160 mg at 10/11/16 1005  . ipratropium-albuterol (DUONEB) 0.5-2.5 (3) MG/3ML nebulizer solution 3 mL  3 mL Nebulization Q4H PRN Lily Kocher, MD      . MEDLINE mouth rinse  15 mL Mouth Rinse BID Lily Kocher, MD   15 mL at 10/10/16 2215  . metolazone (ZAROXOLYN) tablet 10 mg  10 mg Oral BID [name], MD   10 mg at 10/11/16 2330  . ondansetron (ZOFRAN) tablet 4 mg  4 mg Oral Q6H PRN Lily Kocher, MD       Or  . ondansetron [facility]) injection 4 mg  4 mg Intravenous Q6H PRN Lily Kocher, MD      . oxyCODONE (Oxy IR/ROXICODONE) immediate release tablet 5 mg  5 mg Oral Q4H PRN Earlie Counts, NP   5 mg at 10/11/16 0448  . sodium chloride flush (NS) 0.9 % injection 10-40 mL  10-40 mL Intracatheter Q12H Eugenie Filler, MD   20 mL at 10/10/16 2200  . sodium chloride flush (NS) 0.9 % injection 10-40 mL  10-40 mL Intracatheter PRN Eugenie Filler, MD      .  sorbitol 70 % solution 30 mL  30 mL Oral PRN Eugenie Filler, MD      . zolpidem [facility]) tablet 5 mg  5 mg Oral QHS PRN Eugenie Filler, MD        REVIEW OF SYSTEMS:    10 Point review of Systems was done is negative except as noted above.  PHYSICAL EXAMINATION: ECOG PERFORMANCE STATUS: 2-3  . Vitals:   10/11/16 0457 10/11/16 0810  BP: 137/68 128/68  Pulse: 90 90  Resp: 14 17  Temp:  97.4 F (36.3 C)   Filed Weights   10/08/16 2043 10/10/16 0445 10/11/16 0451  Weight: (!) 313 lb 0.9 oz (142 kg) (!) 307 lb 3 oz (139.3 kg) (!) 305 lb 1.6 oz (138.4 kg)   .Body mass index is 43.78 kg/m.  GENERAL:alert, Short of breath at rest with minimal movements  SKIN:Significant petechia and ecchymosis on his extremities and right-sided black eye EYES: conjunctival pallor , anicteric sclera , rt periorbital ecchymosis OROPHARYNX: no exudate, no erythema and lips, buccal mucosa, and tongue normal  NECK: JVD+ LYMPH:  no palpable lymphadenopathy in the cervical, axillary or inguinal LUNGS: b/l basal rales HEART:  B/l LE 4+ pedal edema ABDOMEN: obese no palpable hepatosplenomegaly PSYCH: alert & oriented x 3 NEURO: no focal motor/sensory deficits  LABORATORY DATA:  I have reviewed the data as listed  . CBC Latest Ref Rng & Units 10/11/2016 10/10/2016 10/10/2016  WBC 4.0 - 10.5 K/uL 3.6(L) - 3.8(L)  Hemoglobin 13.0 - 17.0 g/dL 8.6(L) 8.3(L) 7.0(L)  Hematocrit 39.0 - 52.0 % 25.9(L) 25.0(L) 21.6(L)  Platelets 150 - 400  K/uL 74(L) - 80(L)    . CMP Latest Ref Rng & Units 10/11/2016 10/10/2016 10/09/2016  Glucose 65 - 99 mg/dL 95 112(H) 91  BUN 6 - 20 mg/dL 54(H) 50(H) 45(H)  Creatinine 0.61 - 1.24 mg/dL 3.64(H) 3.64(H) 3.70(H)  Sodium 135 - 145 mmol/L 126(L) 125(L) 128(L)  Potassium 3.5 - 5.1 mmol/L 4.4 4.4 4.8  Chloride 101 - 111 mmol/L 85(L) 89(L) 91(L)  CO2 22 - 32 mmol/L 25 23 24   Calcium 8.9 - 10.3 mg/dL 7.9(L) 7.3(L) 7.4(L)  Total Protein 6.5 - 8.1 g/dL - - -  Total Bilirubin 0.3 - 1.2 mg/dL - - -  Alkaline Phos 38 - 126 U/L - - -  AST 15 - 41 U/L - - -  ALT 17 - 63 U/L - - -   Component     Latest Ref Rng & Units 10/06/2016 10/09/2016  WBC     4.0 - 10.5 K/uL 3.8 (L)   RBC     4.22 - 5.81 MIL/uL 2.37 (L)   Hemoglobin     13.0 - 17.0 g/dL 7.1 (L)   HCT     39.0 - 52.0 % 22.7 (L)   MCV     78.0 - 100.0 fL 95.8   MCH     26.0 - 34.0 pg 30.0   MCHC     30.0 - 36.0 g/dL 31.3   RDW     11.5 - 15.5 % 21.3 (H)   Platelets     150 - 400 K/uL 69 (L)   Neutrophils     % 66   Lymphocytes     % 18   Monocytes Relative     % 15   Eosinophil     % 1   Basophil     % 0   NEUT#     1.7 - 7.7  K/uL 2.5   Lymphocyte #     0.7 - 4.0 K/uL 0.7   Monocyte #     0.1 - 1.0 K/uL 0.6   Eosinophils Absolute     0.0 - 0.7 K/uL 0.0   Basophils Absolute     0.0 - 0.1 K/uL 0.0   Sodium     135 - 145 mmol/L 127 (L)   Potassium     3.5 - 5.1 mmol/L 5.8 (H)   Chloride     101 - 111 mmol/L 91 (L)   CO2     22 - 32 mmol/L 23   Glucose     65 - 99 mg/dL 116 (H)   BUN     6 - 20 mg/dL 34 (H)   Creatinine     0.61 - 1.24 mg/dL 3.28 (H)   Calcium     8.9 - 10.3 mg/dL 7.7 (L)   Total Protein     6.5 - 8.1 g/dL 11.9 (H)   Albumin     3.5 - 5.0 g/dL 1.4 (L)   AST     15 - 41 U/L 19   ALT     17 - 63 U/L 14 (L)   Alkaline Phosphatase     38 - 126 U/L 85   Total Bilirubin     0.3 - 1.2 mg/dL 1.2   EGFR (Non-African Amer.)     >60 mL/min 19 (L)   EGFR (African American)     >60 mL/min 22  (L)   Anion gap     5 - 15 13   Iron     45 - 182 ug/dL  79  TIBC     250 - 450 ug/dL  181 (L)  Saturation Ratios     17.9 - 39.5 %  44 (H)  UIBC     ug/dL  102  Kappa free light chain     3.3 - 19.4 mg/L  2,689.5 (H)  Lamda free light chains     5.7 - 26.3 mg/L  3.4 (L)  Kappa, lamda light chain ratio     0.26 - 1.65  791.03 (H)  Prothrombin Time     11.4 - 15.2 seconds 22.7 (H)   INR      1.97   APTT     24 - 36 seconds 70 (H)   Troponin I     <0.03 ng/mL <0.03   B Natriuretic Peptide     0.0 - 100.0 pg/mL 1,535.9 (H)   Vitamin B12     180 - 914 pg/mL  320  Folate     >5.9 ng/mL  10.4  Ferritin     24 - 336 ng/mL  186      RADIOGRAPHIC STUDIES: I have personally reviewed the radiological images as listed and agreed with the findings in the report. US Renal  Result Date: 10/07/2016 CLINICAL DATA:  Acute renal failure EXAM: RENAL / URINARY TRACT ULTRASOUND COMPLETE COMPARISON:  None. FINDINGS: Right Kidney: Length: 12.2 cm. Echogenicity within normal limits. No mass or hydronephrosis visualized. Left Kidney: Length: 12.7 cm. Echogenicity within normal limits. No mass or hydronephrosis visualized. Bladder: The bladder is decompressed with a Foley catheter. Ascites is seen in the abdomen. IMPRESSION: 1. Limited views of the kidneys due to patient body habitus. No acute abnormality. 2. Ascites. Electronically Signed   By: Dorise Bullion III M.D   On: 10/07/2016 21:29   Portable Chest 1 View  Result Date: 10/06/2016  CLINICAL DATA:  Chest pain, history of pneumonia and wheezing. EXAM: PORTABLE CHEST 1 VIEW COMPARISON:  10/04/2016 FINDINGS: There is stable cardiomegaly. No aortic aneurysm. Mild vascular congestion consistent with CHF. Small bilateral pleural effusions with trace fluid in the right minor fissure. Subsegmental atelectasis in the left mid lung versus scarring. Bibasilar densities cannot exclude superimposed pneumonia. No acute osseous abnormality. IMPRESSION:  Stable cardiomegaly with mild CHF. Superimposed bibasilar pneumonia would be difficult to entirely exclude. Trace bilateral pleural effusions. Electronically Signed   By: Ashley Royalty M.D.   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 Certainly possible concurrently with his obvious diagnosis of multiple myeloma. Cannot be definitively proven without a fat pad biopsy or endomyocardial biopsy -- which would not be recommended since it would not change treatment options at this time.  3) Acute renal failure - likely due to his multiple myeloma. 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 Could also be myeloma kidney.  4) Pancytopenia   due to multiple myeloma. Patient is currently transfusion dependent and has needed about 10 units of PRBCs in the last 4-6 weeks.part of his anemia is also due to blood loss from intermittent rectal bleeding and hematuria .  also has thrombocytopenia in the 60-70k range. Pancytopenia will not improve without treatment of his multiple myeloma.  5) Coagulopathy. Patient has elevated PT and PTT. The setting of multiple myeloma this could be due to his paraproteinemia affecting febrile polymerization. If amyloidosis present it can also cause increased factor X clearance and factor X deficiency. Cannot rule out the  possibility of DIC.  Plan -ideally  patient will need a baseline bone marrow biopsy prior to treatment . However given his current cardiorespiratory status I do not think he would tolerate this . -he meets criteria for multiple myeloma based on his previous bone marrow biopsy and his current  Kappa/Lambda Serum Free Light Chain Labs . -Myeloma panel with quantitative immunoglobulin and IFE . -Diuresis and cardiac optimization as per nephrology and cardiology -Will check repeat PT/PTT fibrinogen factor X to evaluate etiology of coagulopathy . -Patient is relatively treatment naive for multiple myeloma and would be assumed to be treatment responsive if his overall health status allows for adequate treatment. -recommended not proceeding with Revlimid in the setting of renal failure and significantly worsened renal function -when discharged I would recommend Vd (with velcade D1,4,8,11 every 21 days) + weekly Dexamethasone and then add Cytoxan from Cycle 2 if tolerated by his counts (CyBorD from 2nd cycle). -I have ordered Velcade 1.70m/m2 weekly + Dexamethasone 462mweekly (while in hospital) -will need renally adjusted Acyclovir (20010mo daily)  for shingles prophylaxis while on velcade and 6 months after. -data regarding benefit of plasmapheresis for light chain nephropathy is nebulous.  I do not specifically recommend this but defer this to the nephrologist. -informed consent was obtained from the patient regarding proceeding with Velcade and Dexamethasone. -PRBC transfusion for HGB<8 or if bleeding -PLT transfusion for PLT,20K or if bleeding (considering associated coagulopathy) -would need to anticipate possible fluid retention with steroids and adjust diuresis accordingly. -patient is aware of the gravity of the situation and the fact that he may land on hemodialysis despite optimal treatment. -we also discussed that given the burden of cares in his situation -- if he decided to pursue comfort  cares thorugh hospice that would not be unreasonable. -patient will continue to followup with his primary oncologist Dr Bobby Rumpf on discharge.   All of the patients questions were answered with apparent satisfaction. The patient knows to call the clinic with any problems, questions or concerns.  I spent 65 minutes counseling the patient face to face. The total time spent in the appointment was 80 minutes and more than 50% was on counseling and direct patient cares.    Sullivan Lone MD [place] AAHIVMS [facility] Hematology/Oncology Physician [facility]  (Office):       [phone] (Work cell):  [phone] (Fax):           [phone]  10/11/2016 10:25 AM

## 2016-10-11 NOTE — Progress Notes (Signed)
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 calcium acetate  667 mg Oral TID WC  . cyanocobalamin  1,000 mcg Intramuscular Daily  . furosemide  160 mg Intravenous Q6H  . furosemide  40 mg Intravenous Once  . mouth rinse  15 mL Mouth Rinse BID  . metolazone  10 mg Oral BID  . sodium chloride flush  10-40 mL Intracatheter Q12H   Continuous Infusions: . DOPamine 2.5 mcg/kg/min (10/11/16 0626)   PRN Meds:.acetaminophen **OR** acetaminophen, calcium carbonate (dosed in mg elemental calcium), camphor-menthol **AND** hydrOXYzine, docusate sodium, feeding supplement (NEPRO CARB STEADY), ipratropium-albuterol, ondansetron **OR** ondansetron (ZOFRAN) IV, oxyCODONE, sodium chloride flush, sorbitol, zolpidem  Current Labs: reviewed    Physical Exam:  Blood pressure 128/68, pulse 90, temperature 97.4 F (36.3 C), temperature source Oral, resp. rate 17, height 5\' 10"  (1.778 m), weight (!) 138.4 kg (305 lb 1.6 oz), SpO2 90 %. Obese, NAD Massive edema/anasarca Chronic venous stasis changes RRR CTAB Protuberant, limited abd exam AAO x3 Nonfocal  A 1. AKI; renal US normal sized kidneys w/o obstruction 2/10 2. Myeloma, recent start CTX Dr. Roni Bread 1. K:L ration 791, absolute Kappa 2689 2. SPEP pending 3. 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 Morbid obesity 5. HTN including low dose ACEi at home; held 6. Hypervolemia 7. Hypervolemic  Hyponatremia  P 1. Cont diuretics, inotrope 2. Cont medical management, no RRT at this poitn 3. Broached goals of care; worrisome outlook; palliative med to see again today 4. Cont Na and fluid restriction   Pearson Grippe MD 10/11/2016, 8:48 AM   Recent Labs Lab 10/09/16 0452 10/10/16 0450 10/11/16 0510  NA 128* 125* 126*  K 4.8 4.4 4.4  CL 91* 89* 85*  CO2 24 23 25   GLUCOSE 91 112* 95  BUN 45* 50* 54*  CREATININE 3.70* 3.64* 3.64*  CALCIUM 7.4* 7.3* 7.9*  PHOS 5.6* 6.3* 5.6*    Recent Labs Lab 10/06/16 2101 10/07/16 1300 10/08/16 0147 10/09/16 0452 10/10/16 0450 10/10/16 2200 10/11/16 0510  WBC 3.8* 4.0 4.3 3.2* 3.8*  --  3.6*  NEUTROABS 2.5 2.9 3.1  --   --   --   --   HGB 7.1* 7.5* 7.6* 7.3* 7.0* 8.3* 8.6*  HCT 22.7* 23.4* 23.7* 22.6* 21.6* 25.0* 25.9*  MCV 95.8 95.1 93.7 93.8 92.3  --  90.9  PLT 69* 71* 82* 80* 80*  --  74*

## 2016-10-11 NOTE — Progress Notes (Signed)
Advanced Heart Failure Rounding Note   Subjective:   61 y/o male with apparent longstanding multiple myeloma (per Dr. Wendy Poet note in [place]) now with severe biventricular HF,multiple myeloma,  renal failure and massive volume overload (R> L HF) not responding to high-dose diuretics.   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 Weight down 2  pounds from yesterday. 316 pounds>>305 pounds.  Creatinine stable 3.64>3.64   SOB at rest.  CVP 25-> 22. Co-ox 66% >> 75.2 yesterday on dopa 2.5. Hgb 7.0 yesterday, got 2 units PRBC's, today Hgb is 8.6.   Last night he was restless and in pain, has severe intermittent pain on his left side. Oxycodone ordered per Palliative. He is resting comfortably now.   Objective:    Vital Signs:   Temp:  [97.5 F (36.4 C)-98.1 F (36.7 C)] 97.6 F (36.4 C) (02/14 0451) Pulse Rate:  [85-90] 90 (02/14 0457) Resp:  [14-24] 14 (02/14 0457) BP: (116-137)/(63-70) 137/68 (02/14 0457) SpO2:  [92 %-100 %] 96 % (02/14 0457) Weight:  [305 lb 1.6 oz (138.4 kg)] 305 lb 1.6 oz (138.4 kg) (02/14 0451) Last BM Date: 10/10/16  Weight change: Filed Weights   10/08/16 2043 10/10/16 0445 10/11/16 0451  Weight: (!) 313 lb 0.9 oz (142 kg) (!) 307 lb 3 oz (139.3 kg) (!) 305 lb 1.6 oz (138.4 kg)    Intake/Output:   Intake/Output Summary (Last 24 hours) at 10/11/16 0715 Last data filed at 10/11/16 0626  Gross per 24 hour  Intake          2044.01 ml  Output             4650 ml  Net         -2605.99 ml     Physical Exam: CVP 22 General: Sitting in the chair.  Eating breakfast  HEENT: normal R periorbital eccyhmosis Neck: supple. Thick neck, JVP to ear . Carotids 2+ bilat; no bruits. No lymphadenopathy or thryomegaly appreciated. Cor: PMI nondisplaced. Regular rate & rhythm. No rubs, gallops 2/6 TR Lungs: Decreased throughout , on room air   Abdomen: obese, soft, nontender, distended. No hepatosplenomegaly. No bruits or masses. Good bowel sounds. Extremities: no cyanosis, clubbing, rash, R and LLE 2-3+ edema R and LLE erythema noted anterior aspect.  Neuro: alert & orientedx3, cranial nerves grossly intact. moves all 4 extremities w/o difficulty. Affect pleasant  Telemetry: NSR 80-90s   Labs: Basic Metabolic Panel:  Recent Labs Lab 10/06/16 2101 10/07/16 1300 10/08/16 0147 10/08/16 1212 10/09/16 0452 10/10/16 0450 10/11/16 0510  NA 127* 129* 128* 129* 128* 125* 126*  K 5.8* 5.2* 5.5* 5.0 4.8 4.4 4.4  CL 91* 96* 92* 95* 91* 89* 85*  CO2 23 23 23 23 24 23 25   GLUCOSE 116* 124* 102* 111* 91 112* 95  BUN 34* 36* 40* 42* 45* 50* 54*  CREATININE 3.28* 3.28* 3.58* 3.61* 3.70* 3.64* 3.64*  CALCIUM 7.7* 7.4* 7.7* 7.6* 7.4* 7.3* 7.9*  MG 2.2 2.3 2.3  --   --  2.1  --   PHOS 4.9* 4.9* 5.3*  --  5.6* 6.3* 5.6*    Liver Function Tests:  Recent Labs Lab 10/06/16 2101 10/07/16 1300 10/08/16 0147 10/09/16 0452 10/10/16 0450 10/11/16 0510  AST 19  --   --   --   --   --   ALT 14*  --   --   --   --   --  ALKPHOS 85  --   --   --   --   --   BILITOT 1.2  --   --   --   --   --   PROT 11.9*  --   --   --   --   --   ALBUMIN 1.4* 1.4* 1.4* 1.5* 1.5* 1.6*    CBC:  Recent Labs Lab 10/06/16 2101 10/07/16 1300 10/08/16 0147 10/09/16 0452 10/10/16 0450 10/10/16 2200 10/11/16 0510  WBC 3.8* 4.0 4.3 3.2* 3.8*  --  3.6*  NEUTROABS 2.5 2.9 3.1  --   --   --   --   HGB 7.1* 7.5* 7.6* 7.3* 7.0* 8.3* 8.6*  HCT 22.7* 23.4* 23.7* 22.6* 21.6* 25.0* 25.9*  MCV 95.8 95.1 93.7 93.8 92.3  --  90.9  PLT 69* 71* 82* 80* 80*  --  74*    Cardiac Enzymes:  Recent Labs Lab 10/06/16 2101 10/07/16 0455 10/07/16 1300  TROPONINI <0.03 <0.03 <0.03    BNP: BNP (last 3 results)  Recent Labs  10/06/16 2101  BNP 1,535.9*    Medications:     Scheduled Medications: . calcium acetate  667 mg Oral TID WC  .  cyanocobalamin  1,000 mcg Intramuscular Daily  . furosemide  160 mg Intravenous Q6H  . furosemide  40 mg Intravenous Once  . mouth rinse  15 mL Mouth Rinse BID  . metolazone  10 mg Oral BID  . sodium chloride flush  10-40 mL Intracatheter Q12H    Infusions: . DOPamine 2.5 mcg/kg/min (10/11/16 0626)    PRN Medications: acetaminophen **OR** acetaminophen, calcium carbonate (dosed in mg elemental calcium), camphor-menthol **AND** hydrOXYzine, docusate sodium, feeding supplement (NEPRO CARB STEADY), ipratropium-albuterol, ondansetron **OR** ondansetron (ZOFRAN) IV, oxyCODONE, sodium chloride flush, sorbitol, zolpidem   Assessment:   1. Acute Biventricular Heart Failure 2. Acute on chronic Renal Failure 3. Anemia 4. Multiple Myeloma 5. Hyponatremia   Plan/Discussion:   61 y/o male with apparent longstanding multiple myeloma (per Dr. 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 Unable to obtain cMRI with elevated creatinine. Kappa/Lamda light chain ratio markedly elevated, which is a poor prognostic sign.   Marked volume overload. CVP 22. Creatinine stable at 3.6. Continue high dose lasix + dopamine 2.5 mcg.    Palliative Care met with him yesterday for [place]. Plan for family meeting today at 37.   Length of Stay: [place] NP-C  10/11/2016, 7:15 AM  Advanced Heart Failure Team Please contact [facility] for night-coverage after hours (4p -7a ) and weekends on [email or web address]  Patient seen and examined with Jettie Booze, NP. We discussed all aspects of the encounter. I agree with the assessment and plan as stated above.   I reached out to Dr. Lavera Guise this am (Mr Lebleu Oncologist at [place] this am). Apparently, Mr Kron was diagnosed with high-grade myeloma 3 years ago but missed appts and did not chose to begin treatment un til earlier this year. He got 3  treatments but then developed rectal bleeding and HF and treatment was stopped. He now has ARF and severe HF with R>>L symptoms due almost certainly to amyloidosis. Given extremely high pre-test probability for amyloidosis we have not pursued biopsy as I don't think it will change our management at this time.   Continues to struggles with massive volume overload but responding moderately to  high-dose lasix, metolazone and dopamine support. I explained to Mr. 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   We discussed options:  1) Attempt at full treatment including reinitiating therapy for his myeloma 2) Focus on symptom management and treatment of HF and renal failure without further myeloma treatment + full code 3) Focus on symptom management and treatment of HF and renal failure without further myeloma treatment + DNR status  They want to think about this more. In the interim will have Oncology see him to help weight options. He is also asking to see SW as he was previously unable to get "an expensive drug" for his myeloma due to cost.   For now continue aggressive volume management. He has at least 20-30 pounds to go.   Times spent 55 mins.   Niemah Schwebke,MD 10:54 AM

## 2016-10-11 NOTE — Progress Notes (Signed)
Daily Progress Note   Patient Name: Lance [name]       Date: 10/11/2016 DOB: 03-Mar-1956  Age: 61 y.o. MRN#: [id number] Attending Physician: Rosita Fire, MD Primary Care Physician: No primary care provider on file. Admit Date: 10/06/2016  Reason for Consultation/Follow-up: Establishing goals of care and Interfamily conflict  Subjective: Met with patient, his sister Sharyn Lull, and brother Coralyn Mark. Gave support to patient expressing his goals and wishes to family. Sharyn Lull is very resistant to supporting patient's decision for DNR and pushes him to reverse- family has experienced much grief in the last year with loss of several family members. Sharyn Lull feels that if patient chooses DNR status then providers will abandon her brother and stop "trying to do everything they can".  Sharyn Lull also expresses spiritual beliefs that God will take patient when ready. We explored those beliefs and explored how God gives [place] the gift of a peaceful death when the body is suffering and sometimes medical interventions can take that gift away and prolong suffering. Brother Coralyn Mark fully supports DNR and does not want patient to suffer. Dellis Filbert and Coralyn Mark understand patient has terminal illness and wish for comfort and quality for the time he has left. Jerrid even expresses concern that he will not leave the hospital, but he would like to try. Daniele's goals are to focus on symptom management, continue attempts to diurese to increase comfort and chooses DNR status. He is open to discussing hospice services at discharge, this will be explored as patient progresses or declines.  At the close of our conversation all family was in agreement of and supportive of patient's choice.   He notes that his pain is still  present, medication does not work as quickly as he would like, but it is providing some relief. He is also having difficulty sleeping. Did not sleep last night due to discomfort.    Review of Systems  Constitutional: Positive for malaise/fatigue.  Respiratory: Positive for shortness of breath and wheezing.   Cardiovascular: Negative for chest pain.  Gastrointestinal: Positive for abdominal pain.  Musculoskeletal: Positive for back pain.  Psychiatric/Behavioral: Negative for depression.    Length of Stay: 5  Current Medications: Scheduled Meds:  . calcium acetate  667 mg Oral TID WC  . cyanocobalamin  1,000 mcg Intramuscular Daily  . furosemide  160  mg Intravenous Q6H  . mouth rinse  15 mL Mouth Rinse BID  . metolazone  10 mg Oral BID  . sodium chloride flush  10-40 mL Intracatheter Q12H    Continuous Infusions: . DOPamine 2.5 mcg/kg/min (10/11/16 0626)    PRN Meds: acetaminophen **OR** acetaminophen, calcium carbonate (dosed in mg elemental calcium), camphor-menthol **AND** hydrOXYzine, docusate sodium, feeding supplement (NEPRO CARB STEADY), ipratropium-albuterol, ondansetron **OR** ondansetron (ZOFRAN) IV, oxyCODONE, sodium chloride flush, sorbitol, zolpidem  Physical Exam  Constitutional: He is oriented to person, place, and time. He appears well-developed and well-nourished.  Cardiovascular:  Diffuse anasarca, heart sounds distant  Pulmonary/Chest: He has wheezes.  Abdominal: He exhibits distension. There is tenderness.  Musculoskeletal: He exhibits edema.  Neurological: He is oriented to person, place, and time.  Drowsy, but able to participate meaningfully in conversation            Vital Signs: BP 135/68 (BP Location: Left Arm)   Pulse 87   Temp 97.7 F (36.5 C) (Oral)   Resp 15   Ht _0  (1.778 m)   Wt (!) 138.4 kg (305 lb 1.6 oz)   SpO2 98%   BMI 43.78 kg/m  SpO2: SpO2: 98 % O2 Device: O2 Device: Not Delivered O2 Flow Rate: O2 Flow Rate (L/min): 3  L/min  Intake/output summary:  Intake/Output Summary (Last 24 hours) at 10/11/16 1337 Last data filed at 10/11/16 1300  Gross per 24 hour  Intake          2228.01 ml  Output             5400 ml  Net         -3171.99 ml   LBM: Last BM Date: 10/10/16 Baseline Weight: Weight: (!) 143.6 kg (316 lb 9.3 oz) Most recent weight: Weight: (!) 138.4 kg (305 lb 1.6 oz)       Palliative Assessment/Data: PPS:  30%    Flowsheet Rows   Flowsheet Row Most Recent Value  Intake Tab  Referral Department  Hospitalist  Unit at Time of Referral  Med/Surg Unit  Palliative Care Primary Diagnosis  Cancer  Date Notified  10/09/16  Palliative Care Type  New Palliative care  Reason for referral  Clarify Goals of Care  Date of Admission  10/06/16  # of days IP prior to Palliative referral  3  Clinical Assessment  Psychosocial & Spiritual Assessment  Palliative Care Outcomes      Patient Active Problem List   Diagnosis Date Noted  . Advance care planning   . Goals of care, counseling/discussion   . Palliative care by specialist   . 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 Acute respiratory failure with hypoxia ([place]) 10/07/2016  . Hyperkalemia 10/07/2016  . Hyperphosphatemia 10/07/2016  . Hypocalcemia 10/07/2016  . Anemia 10/07/2016  . Multiple myeloma ([place]) 10/07/2016  . Chest pain   . Acute on chronic combined systolic and diastolic congestive heart failure ([place]) 10/06/2016  . ARF (acute renal failure) ([place]) 10/06/2016  . HTN (hypertension) 10/06/2016  . Anasarca 10/06/2016    Palliative Care Assessment & Plan   Patient Profile: 61 y.o. 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 He was transferred to [facility] from [facility] for nephrology consult d/t AKI in setting of sever CHF and volume overload not responding to high-dose diuretics. 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 Palliative medicine consulted for [place].  Assessment/Recommendations/Plan   DNR  Trazodone 17m qhs prn  Continue aggressive symptom management and attempts at diruesis, do not escalate care  PMT will continue to follow and address [place]  Code Status:  DNR  Prognosis:   < 3 months may anticipate hospital death if he does not begin to improve    Discharge Planning:  To Be Determined  Care plan was discussed with patient, his siblings, and patient's RN- KCyril Mourning  Thank you for allowing the Palliative Medicine Team to assist in the care of this patient.   Total time: 90 minutes Prolonged services billed: Yes  Greater than 50%  of this time was spent counseling and coordinating care related to the above assessment and plan.  KMariana Kaufman AGNP-C Palliative Medicine   Please contact Palliative Medicine Team phone at [phone] questions and concerns.

## 2016-10-11 NOTE — Progress Notes (Addendum)
PROGRESS NOTE    Lance [name]  XMI:[id number] DOB: Jan 17, 1956 DOA: 10/06/2016 PCP: No primary care provider on file.   Brief Narrative: 61 y.o.gentleman with a history of transfusion dependent multiple myeloma, systolic heart failure with EF 35-40% in January 2018, pancytopenia, and HTN who was admitted to [facility] on 10/04/2016 for management of CHF exacerbation. The patient has required several transfusions in the past 30 days. He presented with shortness of breath and anasarca. Transferred from [facility] for nephrology consultation. At [place], BNP was [address]000. He had a CT of the abdomen and pelvis without contrast that was negative for hydronephrosis and kidney stones.. Moderate ascites noted. Chest xray showed cardiomegaly, mild CHF, and small bilateral effusions. Lower extremity dopplers were negative for DVT.  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 Echocardiogram with EF of 35% with diffuse hypokinesis. -Patient is currently on IV Lasix 160 mg every 6 hourly and metolazone 10 mg oral twice a day. -Patient had urine output of 4650 cc/24 hours with net negative by 6 L since admission -Currently on dopamine IV for inotropic support. -Monitor electrolytes, renal function -Cardiology consult appreciated.  #Acute kidney injury on chronic kidney disease likely stage III: Likely in the setting of multiple myeloma and congestive heart failure. -Serum creatinine level stable today. -Patient is currently on IV diuretics with inotropic support -Nephrology consult appreciated. Continue to monitor renal function. Avoid nephrotoxins.  #Acute hypoxic respiratory failure in the setting of CHF: -Currently on room air. Continue to monitor  #History of multiple myeloma: Patient was followed by oncologist outpatient and was treated recently.  Patient has Lambda ratio of 791. -Oncology consult following. -Monitor hemoglobin, received 2 units of red blood cell transfusion on 2/13  #Hyponatremia in the setting of congestive heart failure and diuretics. Serum sodium level 126. Wanted to monitor. I will check urine electrolytes and urine osmolality.  # Goals of care discussion: Palliative care consult meeting with the patient and family today. Continue to provide supportive care.  Principal Problem:   Acute respiratory failure with hypoxia (HCC) Active Problems:   Acute on chronic systolic CHF (congestive heart failure) (HCC)   ARF (acute renal failure) (HCC)   HTN (hypertension)   Anasarca   Hyperkalemia   Hyperphosphatemia   Hypocalcemia   Anemia   Multiple myeloma (HCC)   Chest pain   Cardiac amyloidosis [facility])   Advance care planning   Goals of care, counseling/discussion   Palliative care by specialist  DVT prophylaxis: SCD. No anticoagulation because of anemia and thrombocytopenia. Code Status: Full code Family Communication: Patient's sister at bedside Disposition Plan: The discharge home in 3-4 days    Consultants:   Cardiologist  Nephrologist  Palliative care  Procedures:  Chest x-ray 10/06/2016  2-D echo  10/07/2016  Renal ultrasound 10/07/2016  PICC line 10/09/2016 Antimicrobials: None Subjective: Patient was seen and examined at bedside. Patient reported weakness. No new event. Denied chest pain, shortness of breath, nausea or vomiting. Sister at bedside.  Objective: Vitals:   10/11/16 0049 10/11/16 0451 10/11/16 0457 10/11/16 0810  BP: 124/65  137/68 128/68  Pulse: 85  90 90  Resp: 18  14 17   Temp:  97.6 F (36.4 C)  97.4 F (36.3 C)  TempSrc:    Oral  SpO2: 93%  96% 90%  Weight:  (!) 138.4 kg (305 lb 1.6 oz)    Height:  Intake/Output Summary (Last 24 hours) at 10/11/16 1048 Last data filed at 10/11/16 0811  Gross per 24 hour  Intake          1978.01 ml  Output              4600 ml  Net         -2621.99 ml   Filed Weights   10/08/16 2043 10/10/16 0445 10/11/16 0451  Weight: (!) 142 kg (313 lb 0.9 oz) (!) 139.3 kg (307 lb 3 oz) (!) 138.4 kg (305 lb 1.6 oz)    Examination:  General exam: Appears calm and comfortable . Sitting on chair Respiratory system: Bibasal decreased breath sound. Respiratory effort normal.  Cardiovascular system: S1 & S2 heard, RRR.   Gastrointestinal system: Abdomen is soft, nontender. Distended. Normal bowel sound Central nervous system: Alert and oriented. No focal neurological deficits. Extremities: Bilateral lower extremities with pitting edema and erythematous skin. Skin: No rashes, lesions or ulcers Psychiatry: Judgement and insight appear normal. Mood & affect appropriate.     Data Reviewed: I have personally reviewed following labs and imaging studies  CBC:  Recent Labs Lab 10/06/16 2101 10/07/16 1300 10/08/16 0147 10/09/16 0452 10/10/16 0450 10/10/16 2200 10/11/16 0510  WBC 3.8* 4.0 4.3 3.2* 3.8*  --  3.6*  NEUTROABS 2.5 2.9 3.1  --   --   --   --   HGB 7.1* 7.5* 7.6* 7.3* 7.0* 8.3* 8.6*  HCT 22.7* 23.4* 23.7* 22.6* 21.6* 25.0* 25.9*  MCV 95.8 95.1 93.7 93.8 92.3  --  90.9  PLT 69* 71* 82* 80* 80*  --  74*   Basic Metabolic Panel:  Recent Labs Lab 10/06/16 2101 10/07/16 1300 10/08/16 0147 10/08/16 1212 10/09/16 0452 10/10/16 0450 10/11/16 0510  NA 127* 129* 128* 129* 128* 125* 126*  K 5.8* 5.2* 5.5* 5.0 4.8 4.4 4.4  CL 91* 96* 92* 95* 91* 89* 85*  CO2 23 23 23 23 24 23 25   GLUCOSE 116* 124* 102* 111* 91 112* 95  BUN 34* 36* 40* 42* 45* 50* 54*  CREATININE 3.28* 3.28* 3.58* 3.61* 3.70* 3.64* 3.64*  CALCIUM 7.7* 7.4* 7.7* 7.6* 7.4* 7.3* 7.9*  MG 2.2 2.3 2.3  --   --  2.1  --   PHOS 4.9* 4.9* 5.3*  --  5.6* 6.3* 5.6*   GFR: Estimated Creatinine Clearance: 29.5 mL/min (by C-G formula based on SCr of 3.64 mg/dL (H)). Liver Function Tests:  Recent Labs Lab 10/06/16 2101 10/07/16 1300  10/08/16 0147 10/09/16 0452 10/10/16 0450 10/11/16 0510  AST 19  --   --   --   --   --   ALT 14*  --   --   --   --   --   ALKPHOS 85  --   --   --   --   --   BILITOT 1.2  --   --   --   --   --   PROT 11.9*  --   --   --   --   --   ALBUMIN 1.4* 1.4* 1.4* 1.5* 1.5* 1.6*   No results for input(s): LIPASE, AMYLASE in the last 168 hours. No results for input(s): AMMONIA in the last 168 hours. Coagulation Profile:  Recent Labs Lab 10/06/16 2101  INR 1.97   Cardiac Enzymes:  Recent Labs Lab 10/06/16 2101 10/07/16 0455 10/07/16 1300  TROPONINI <0.03 <0.03 <0.03   BNP (last 3 results) No results for input(s): PROBNP  in the last 8760 hours. HbA1C: No results for input(s): HGBA1C in the last 72 hours. CBG:  Recent Labs Lab 10/06/16 2012 10/07/16 0809 10/09/16 0827  GLUCAP 97 83 94   Lipid Profile: No results for input(s): CHOL, HDL, LDLCALC, TRIG, CHOLHDL, LDLDIRECT in the last 72 hours. Thyroid Function Tests: No results for input(s): TSH, T4TOTAL, FREET4, T3FREE, THYROIDAB in the last 72 hours. Anemia Panel:  Recent Labs  10/09/16 0452  VITAMINB12 320  FOLATE 10.4  FERRITIN 186  TIBC 181*  IRON 79   Sepsis Labs: No results for input(s): PROCALCITON, LATICACIDVEN in the last 168 hours.  Recent Results (from the past 240 hour(s))  Urine culture     Status: None   Collection Time: 10/07/16 10:10 PM  Result Value Ref Range Status   Specimen Description URINE, RANDOM  Final   Special Requests ADDED 0523 10/08/16  Final   Culture NO GROWTH  Final   Report Status 10/09/2016 FINAL  Final  MRSA PCR Screening     Status: None   Collection Time: 10/09/16  1:26 PM  Result Value Ref Range Status   MRSA by PCR NEGATIVE NEGATIVE Final    Comment:        The GeneXpert MRSA Assay (FDA approved for NASAL specimens only), is one component of a comprehensive MRSA colonization surveillance program. It is not intended to diagnose MRSA infection nor to guide  or monitor treatment for MRSA infections.          Radiology Studies: No results found.      Scheduled Meds: . calcium acetate  667 mg Oral TID WC  . cyanocobalamin  1,000 mcg Intramuscular Daily  . furosemide  160 mg Intravenous Q6H  . mouth rinse  15 mL Mouth Rinse BID  . metolazone  10 mg Oral BID  . sodium chloride flush  10-40 mL Intracatheter Q12H   Continuous Infusions: . DOPamine 2.5 mcg/kg/min (10/11/16 0626)     LOS: 5 days    Lance Keeran Tanna Furry, MD [facility] Pager [phone]  If 7PM-7AM, please contact night-coverage [email or web address] Password [facility] 10/11/2016, 10:48 AM

## 2016-10-12 DIAGNOSIS — I5043 Acute on chronic combined systolic (congestive) and diastolic (congestive) heart failure: Secondary | ICD-10-CM

## 2016-10-12 LAB — COOXEMETRY PANEL
CARBOXYHEMOGLOBIN: 2.2 % — AB (ref 0.5–1.5)
METHEMOGLOBIN: 1 % (ref 0.0–1.5)
O2 Saturation: 71.8 %
TOTAL HEMOGLOBIN: 9.4 g/dL — AB (ref 12.0–16.0)

## 2016-10-12 LAB — RENAL FUNCTION PANEL
ALBUMIN: 1.6 g/dL — AB (ref 3.5–5.0)
ANION GAP: 14 (ref 5–15)
BUN: 57 mg/dL — ABNORMAL HIGH (ref 6–20)
CALCIUM: 8.2 mg/dL — AB (ref 8.9–10.3)
CO2: 28 mmol/L (ref 22–32)
Chloride: 86 mmol/L — ABNORMAL LOW (ref 101–111)
Creatinine, Ser: 3.36 mg/dL — ABNORMAL HIGH (ref 0.61–1.24)
GFR, EST AFRICAN AMERICAN: 21 mL/min — AB (ref >60)
GFR, EST NON AFRICAN AMERICAN: 18 mL/min — AB (ref >60)
Glucose, Bld: 111 mg/dL — ABNORMAL HIGH (ref 65–99)
PHOSPHORUS: 5.5 mg/dL — AB (ref 2.5–4.6)
POTASSIUM: 4.4 mmol/L (ref 3.5–5.1)
SODIUM: 128 mmol/L — AB (ref 135–145)

## 2016-10-12 LAB — FACTOR 10 ASSAY: Factor X Activity: 52 % — ABNORMAL LOW (ref 76–183)

## 2016-10-12 MED ORDER — ACYCLOVIR 200 MG PO CAPS
200.0000 mg | ORAL_CAPSULE | Freq: Every day | ORAL | Status: DC
  Administered 2016-10-13 – 2016-10-20 (×10): 200 mg via ORAL
  Filled 2016-10-12 (×10): qty 1

## 2016-10-12 MED ORDER — PANTOPRAZOLE SODIUM 40 MG PO TBEC
40.0000 mg | DELAYED_RELEASE_TABLET | Freq: Every day | ORAL | Status: DC
  Administered 2016-10-12 – 2016-10-20 (×10): 40 mg via ORAL
  Filled 2016-10-12 (×9): qty 1

## 2016-10-12 NOTE — Progress Notes (Signed)
Spoke to [employer] regarding Velcade injection, states that the injection will be placed on hold and will notify the IV team once a decision is made. [employer]

## 2016-10-12 NOTE — Progress Notes (Signed)
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  Sister stated she understood and was realistic but also holds quite a bit of optimism.    02/14 0701 - 02/15 0700 In: 1135.2 [P.O.:720; I.V.:151.2; IV Piggyback:264] Out: 6050 [Urine:6050]  Filed Weights   10/10/16 0445 10/11/16 0451 10/12/16 0504  Weight: (!) 139.3 kg (307 lb 3 oz) (!) 138.4 kg (305 lb 1.6 oz) 135.4 kg (298 lb 9.6 oz)    Scheduled Meds: . acyclovir  200 mg Oral Daily  . bortezomib SQ  1.5 mg/m2 Subcutaneous Once  . calcium acetate  667 mg Oral TID WC  . cyanocobalamin  1,000 mcg Intramuscular Daily  . dexamethasone  20 mg Intravenous Once  . dexamethasone  20 mg Intravenous Once  . furosemide  160 mg Intravenous Q6H  . mouth rinse  15 mL Mouth Rinse BID  . metolazone  10 mg Oral BID  . sodium chloride flush  10-40 mL Intracatheter Q12H   Continuous Infusions: . DOPamine 2.5 mcg/kg/min (10/11/16 0626)   PRN Meds:.acetaminophen **OR** acetaminophen, calcium carbonate (dosed in mg elemental calcium), camphor-menthol **AND** hydrOXYzine, docusate sodium, feeding supplement (NEPRO CARB STEADY), ipratropium-albuterol, ondansetron **OR** ondansetron (ZOFRAN) IV, oxyCODONE, sodium chloride flush, sorbitol, traZODone, zolpidem  Current Labs: reviewed    Physical Exam:  Blood pressure  133/67, pulse 94, temperature 97.5 F (36.4 C), temperature source Oral, resp. rate 17, height _0  (1.778 m), weight 135.4 kg (298 lb 9.6 oz), SpO2 99 %. Obese, NAD Massive edema/anasarca Chronic venous stasis changes RRR CTAB Protuberant, limited abd exam AAO x3 Nonfocal  A 1. AKI and unclear chronicity; renal US normal sized kidneys w/o obstruction 2/10 2. Myeloma, recent start CTX Dr. Roni Bread 1. K:L ration 791, absolute Kappa 2689 2. SPEP pending 3. Oncology following inpatient 3. 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 Morbid obesity 5. HTN including low dose ACEi at home; held 6. Hypervolemia 7. Hypervolemic Hyponatremia  P 1. Cont diuretics, inotrope; tolerating well 2. Cont medical management, no RRT at this poitn 3. Appreciate palliative med input 4. TPE has been deferred given comorbidities and uncertainty of any benefit; agree with oncology 5. Cont Na and fluid restriction   Pearson Grippe MD 10/12/2016, 8:29 AM   Recent Labs Lab 10/10/16 0450 10/11/16 0510 10/12/16 0500  NA 125* 126* 128*  K 4.4 4.4 4.4  CL 89* 85* 86*  CO2 _1 GLUCOSE 112* 95 111*  BUN 50* 54* 57*  CREATININE 3.64* 3.64* 3.36*  CALCIUM 7.3* 7.9* 8.2*  PHOS 6.3* 5.6* 5.5*    Recent Labs Lab 10/06/16 2101 10/07/16 1300 10/08/16 0147 10/09/16 0452 10/10/16 0450 10/10/16 2200 10/11/16 0510  WBC 3.8* 4.0 4.3 3.2* 3.8*  --  3.6*  NEUTROABS 2.5 2.9 3.1  --   --   --   --  HGB 7.1* 7.5* 7.6* 7.3* 7.0* 8.3* 8.6*  HCT 22.7* 23.4* 23.7* 22.6* 21.6* 25.0* 25.9*  MCV 95.8 95.1 93.7 93.8 92.3  --  90.9  PLT 69* 71* 82* 80* 80*  --  74*

## 2016-10-12 NOTE — Progress Notes (Signed)
Spoke with patient about insurance coverage of Velcade.  Explained that insurance is billed for and pays based on a DRG code for the hospitalization and the medication would not affect that code. On the outpatient side, the [facility] has systems in place for prior authorization and working with insurance to determine what is covered for the patient.  I also explained it was too late in the day for Velcade to be given and we would move the dose to tomorrow. Dr. Irene Limbo aware through help of pharmacist Terri at [facility].  Patient and sister expressed understanding and gratitude for my visit and explanation. I told them I would come back tomorrow morning between 8 and 830 to get his final agreement to the Velcade dose prior to calling [facility] to notify them to compound the dose.  Cloria Ciresi D. Nancy Arvin, PharmD, BCPS Clinical Pharmacist Pager: [phone] 10/12/2016 2:15 PM

## 2016-10-12 NOTE — Progress Notes (Signed)
Advanced Heart Failure Rounding Note   Subjective:   62 y/o male with apparent longstanding multiple myeloma (per Dr. Wendy Poet note in [place]) now with severe biventricular HF,multiple myeloma,  renal failure and massive volume overload (R> L HF) not responding to high-dose diuretics.   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   Continue to diurese well. Weight down another 7 pounds (total 18 pounds). Creatinine improving. CVP 19 Co-ox 72%  Resting comfortably in chair. Was seen by Palliative Care and Oncology yesterday. Started on velcade and dexamethasone. Now DNR.   Sister at bedside being very difficult with nurses. Told me this morning that they were giving him pain medications against his will when she walked out. Also accused me of "forcing" his diagnosis on him.    Objective:    Vital Signs:   Temp:  [97.2 F (36.2 C)-97.9 F (36.6 C)] 97.5 F (36.4 C) (02/15 0504) Pulse Rate:  [87-94] 94 (02/15 0504) Resp:  [14-17] 17 (02/15 0504) BP: (115-135)/(30-68) 133/67 (02/15 0504) SpO2:  [90 %-99 %] 99 % (02/15 0504) Weight:  [135.4 kg (298 lb 9.6 oz)] 135.4 kg (298 lb 9.6 oz) (02/15 0504) Last BM Date: 10/10/16  Weight change: Filed Weights   10/10/16 0445 10/11/16 0451 10/12/16 0504  Weight: (!) 139.3 kg (307 lb 3 oz) (!) 138.4 kg (305 lb 1.6 oz) 135.4 kg (298 lb 9.6 oz)    Intake/Output:   Intake/Output Summary (Last 24 hours) at 10/12/16 0715 Last data filed at 10/12/16 0500  Gross per 24 hour  Intake           1135.2 ml  Output             6050 ml  Net          -4914.8 ml     Physical Exam: CVP 19 General: Sitting in the chair. Resting HEENT: normal R periorbital eccyhmosis Neck: supple. Thick neck, JVP to ear . Carotids 2+ bilat; no bruits. No lymphadenopathy or thryomegaly appreciated. Cor: PMI nondisplaced. Regular rate &  rhythm. No rubs, gallops 2/6 TR Lungs: Decreased throughout , on room air  Abdomen: obese, soft, nontender, distended. No hepatosplenomegaly. No bruits or masses. Good bowel sounds. Extremities: no cyanosis, clubbing, rash, R and LLE 2-3+ edema R and LLE erythema noted anterior aspect of both LEs  Neuro: alert & orientedx3, cranial nerves grossly intact. moves all 4 extremities w/o difficulty. Affect pleasant  Telemetry: NSR 90s   Labs: Basic Metabolic Panel:  Recent Labs Lab 10/06/16 2101 10/07/16 1300 10/08/16 0147 10/08/16 1212 10/09/16 0452 10/10/16 0450 10/11/16 0510 10/12/16 0500  NA 127* 129* 128* 129* 128* 125* 126* 128*  K 5.8* 5.2* 5.5* 5.0 4.8 4.4 4.4 4.4  CL 91* 96* 92* 95* 91* 89* 85* 86*  CO2 23 23 23 23 24 23 25 28   GLUCOSE 116* 124* 102* 111* 91 112* 95 111*  BUN 34* 36* 40* 42* 45* 50* 54* 57*  CREATININE 3.28* 3.28* 3.58* 3.61* 3.70* 3.64* 3.64* 3.36*  CALCIUM 7.7* 7.4* 7.7* 7.6* 7.4* 7.3* 7.9* 8.2*  MG 2.2 2.3 2.3  --   --  2.1  --   --   PHOS 4.9* 4.9* 5.3*  --  5.6* 6.3* 5.6* 5.5*    Liver Function Tests:  Recent Labs Lab 10/06/16 2101  10/08/16 0147 10/09/16 0452 10/10/16 0450 10/11/16  0510 10/12/16 0500  AST 19  --   --   --   --   --   --   ALT 14*  --   --   --   --   --   --   ALKPHOS 85  --   --   --   --   --   --   BILITOT 1.2  --   --   --   --   --   --   PROT 11.9*  --   --   --   --   --   --   ALBUMIN 1.4*  < > 1.4* 1.5* 1.5* 1.6* 1.6*  < > = values in this interval not displayed.  CBC:  Recent Labs Lab 10/06/16 2101 10/07/16 1300 10/08/16 0147 10/09/16 0452 10/10/16 0450 10/10/16 2200 10/11/16 0510  WBC 3.8* 4.0 4.3 3.2* 3.8*  --  3.6*  NEUTROABS 2.5 2.9 3.1  --   --   --   --   HGB 7.1* 7.5* 7.6* 7.3* 7.0* 8.3* 8.6*  HCT 22.7* 23.4* 23.7* 22.6* 21.6* 25.0* 25.9*  MCV 95.8 95.1 93.7 93.8 92.3  --  90.9  PLT 69* 71* 82* 80* 80*  --  74*    Cardiac Enzymes:  Recent Labs Lab 10/06/16 2101 10/07/16 0455  10/07/16 1300  TROPONINI <0.03 <0.03 <0.03    BNP: BNP (last 3 results)  Recent Labs  10/06/16 2101  BNP 1,535.9*    Medications:     Scheduled Medications: . bortezomib SQ  1.5 mg/m2 Subcutaneous Once  . calcium acetate  667 mg Oral TID WC  . cyanocobalamin  1,000 mcg Intramuscular Daily  . dexamethasone  20 mg Intravenous Once  . dexamethasone  20 mg Intravenous Once  . furosemide  160 mg Intravenous Q6H  . mouth rinse  15 mL Mouth Rinse BID  . metolazone  10 mg Oral BID  . sodium chloride flush  10-40 mL Intracatheter Q12H    Infusions: . DOPamine 2.5 mcg/kg/min (10/11/16 0626)    PRN Medications: acetaminophen **OR** acetaminophen, calcium carbonate (dosed in mg elemental calcium), camphor-menthol **AND** hydrOXYzine, docusate sodium, feeding supplement (NEPRO CARB STEADY), ipratropium-albuterol, ondansetron **OR** ondansetron (ZOFRAN) IV, oxyCODONE, sodium chloride flush, sorbitol, traZODone, zolpidem   Assessment:   1. Acute Biventricular Heart Failure 2. Acute on chronic Renal Failure 3. Anemia 4. Multiple Myeloma 5. Hyponatremia   Plan/Discussion:    61 y/o male with apparent longstanding multiple myeloma (per Dr. 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  Continues with massive volume overload but responding very wellto high-dose lasix, metolazone and dopamine support. CVP coming down. Co-ox looks good. Creatinine improving. Will continue current regimen. He refuses leg wraps.   Seen by Oncology yesterday and started of velcade and dexamethasone. Also seen by Palliative Care and now DNR by his own wishes - although sister has tried repeatedly to talk him out of this.   I again tried to explain to his sister that he has a terminal diagnosis with months to live and she repeatedly denied the severity of his illness and told me I was forcing the  diagnosis on him. When I told her that she was incorrect and that I was just being honest and answering the questions he had about his condition she then said "let me rechoose my words. You are seriously overstating his condition to  him." I told her that this was also incorrect and that as she was not his HCPOA that I would only discuss his medical condition with him directly and if she wanted to lidten that was fine but I would no longer speak to her directly about his condition. Will notify risk management   Length of Stay: 6  Tristian Bouska MD  10/12/2016, 7:15 AM  Advanced Heart Failure Team Please contact [facility] for night-coverage after hours (4p -7a ) and weekends on [email or web address] Israa Caban,MD 7:15 AM

## 2016-10-12 NOTE — Care Management Note (Addendum)
Case Management Note  Patient Details  Name: Lance [name] MRN: [id number] Date of Birth: 08-Jul-1956  Subjective/Objective:   Pt presented for Acute Respiratory Failure. Pt initiated on IV Lasix and IV Dopamine gtt. Pt is from home and has support of brother Coralyn Mark and Sister that is at the bedside. Palliative Care is following.                  Action/Plan: CM has a benefits check in process for the chemo medication. Unsure if will be able to get the exact cost. Family is aware that CM is looking into the cost.    Expected Discharge Date:  10/09/16               Expected Discharge Plan:  [place]  In-House Referral:  Clinical Social Work  Discharge planning Services  CM Consult  Post Acute Care Choice:   Durable Medical Equipment.  Choice offered to:   Patient, Sibling  DME Arranged:   RW, 3n1, Wheelchair DME Agency:   [facility] Arranged:    Patient Refused Services [place] Agency:   N/A  Status of Service:  Completed.  If discussed at [place] of Stay Meetings, dates discussed:  10-17-16   Additional Comments: 10-20-16 [place], RN, BSN [phone] CM did speak with pt and sister in regards to [facility]. Pt is refusing [facility]. Pt is agreeable to DME at this time via [facility]. RW, 3n1 and WC to be delivered to room before d/c. Staff RN to make CM aware if needed. Sister to take patient to appointments. CM trying to call PCP in regards to wound care. Refusing HHRN for wound care at this time. Pt uses [facility] did call to see Orlinda at [phone] and appointment is scheduled for 10-27-16 @ 10:00 am. CM to fax over additional notes for information. Appointment placed on AVS. Per staff RN pt will not qualify for home 02. Pt refusing the 02 before pulmonary sat note completed as well. No further needs from CM at this time.       10-17-16 [place], RN, BSN  [phone] IV Dopamine gtt continues for inotropic support. CM will continue to monitor for additional needs.   1429 10-12-16 Jacqlyn Krauss, RN,BSN [phone] Benefits Check for outpatient cost only: Pharmacy did discuss with pt in regards to how cost will be formulated while in the hospital. Medication will need prior approval. CM will continue to monitor for additional home needs.   S/W  [facility] RX # [phone]    BORTEZOMB SQ ( VELCADE ) 4MG    COVER- NOT COVER Kathyrn Lass EXCULSION   PRIOR APPROVAL- [phone]   Bethena Roys, RN 10/12/2016, 11:34 AM

## 2016-10-12 NOTE — Progress Notes (Signed)
PROGRESS NOTE    Lance [name]  KNL:[id number] DOB: [birth year] DOA: 10/06/2016 PCP: No primary care provider on file.   Brief Narrative: 61 y.o.gentleman with a history of transfusion dependent multiple myeloma, systolic heart failure with EF 35-40% in January 2018, pancytopenia, and HTN who was admitted to [facility] on 10/04/2016 for management of CHF exacerbation. The patient has required several transfusions in the past 30 days. He presented with shortness of breath and anasarca. Transferred from [facility] for nephrology consultation. At [place], BNP was 13000. He had a CT of the abdomen and pelvis without contrast that was negative for hydronephrosis and kidney stones.. Moderate ascites noted. Chest xray showed cardiomegaly, mild CHF, and small bilateral effusions. Lower extremity dopplers were negative for DVT.  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 Echocardiogram with EF of 35% with diffuse hypokinesis. -Patient is currently on IV Lasix 160 mg every 6 hourly and metolazone 10 mg oral twice a day. -Patient has significant urine output with net negative by almost 11 L since admission. -Currently on dopamine IV for inotropic support. -Monitor electrolytes, renal function -Cardiology consult appreciated.  #Acute kidney injury on chronic kidney disease likely stage III: Likely in the setting of multiple myeloma and congestive heart failure. -Serum creatinine level trending down. Continue to monitor electrolytes and renal function -Patient is currently on IV diuretics with inotropic support -Nephrology consult appreciated. Continue to monitor renal function. Avoid nephrotoxins.  #Acute hypoxic respiratory failure in the setting of CHF: -Currently on room air. Continue to monitor  #History of multiple myeloma: Patient was  followed by oncologist outpatient and was treated recently. Patient has Lambda ratio of 791. -Oncology consult following. Starting chemotherapy including Velcade, steroid, acyclovir. I discussed with the pharmacist and social worker today. -Monitor hemoglobin, received 2 units of red blood cell transfusion on 2/13  #Hyponatremia in the setting of congestive heart failure and diuretics. Serum sodium level 128. Serum sodium level expected to improve with loop diuretics.  Urine electrolytes and osmolality reviewed.  # Goals of care discussion: Palliative care consult appreciated. Patient is DO NOT RESUSCITATE. Patient understand the severity of illness. I discussed with the patient and his sister at bedside.  Principal Problem:   Acute respiratory failure with hypoxia (HCC) Active Problems:   Acute on chronic combined systolic and diastolic congestive heart failure (HCC)   ARF (acute renal failure) (HCC)   HTN (hypertension)   Anasarca   Hyperkalemia   Hyperphosphatemia   Hypocalcemia   Antineoplastic chemotherapy induced pancytopenia (HCC)   Multiple myeloma (HCC)   Chest pain   Cardiac amyloidosis [facility])   Advance care planning   Goals of care, counseling/discussion   Palliative care by specialist   Bleeding diathesis ([place])  DVT prophylaxis: SCD. No anticoagulation because of anemia and thrombocytopenia. Code Status: Full code Family Communication: Patient's sister at bedside Disposition Plan: The discharge home in 3-4 days    Consultants:   Cardiologist  Nephrologist  Palliative care  Procedures:  Chest x-ray 10/06/2016  2-D echo  10/07/2016  Renal ultrasound 10/07/2016  PICC line 10/09/2016 Antimicrobials: None Subjective: Patient was seen and examined at bedside. No new event. Has increased urinary output. Denied nausea vomiting, chest pain, shortness of breath, abdominal pain.  Objective: Vitals:   10/12/16 0015 10/12/16 0504 10/12/16 0852 10/12/16 1305  BP:  124/63 133/67 129/63 119/63  Pulse: 93 94  Resp: 16 17    Temp: 97.9 F (36.6 C) 97.5 F (36.4 C) 97.4 F (36.3 C) 97.8 F (36.6 C)  TempSrc: Oral Oral Oral Oral  SpO2: 91% 99% 96% 97%  Weight:  135.4 kg (298 lb 9.6 oz)    Height:        Intake/Output Summary (Last 24 hours) at 10/12/16 1557 Last data filed at 10/12/16 1500  Gross per 24 hour  Intake            918.2 ml  Output             6050 ml  Net          -5131.8 ml   Filed Weights   10/10/16 0445 10/11/16 0451 10/12/16 0504  Weight: (!) 139.3 kg (307 lb 3 oz) (!) 138.4 kg (305 lb 1.6 oz) 135.4 kg (298 lb 9.6 oz)    Examination:  General exam: Not in distress, sitting on chair comfortable  Respiratory system: Bibasal decreased breath sound, respiratory effort normal.  Cardiovascular system: S1 & S2 heard, RRR.   Gastrointestinal system: Abdomen is soft, nontender. Distended. Normal bowel sound Central nervous system: Alert and oriented. No focal neurological deficits. Extremities: Bilateral lower extremities with pitting edema and erythematous skin, no significant change. Skin: No rashes, lesions or ulcers Psychiatry: Judgement and insight appear normal. Mood & affect appropriate.     Data Reviewed: I have personally reviewed following labs and imaging studies  CBC:  Recent Labs Lab 10/06/16 2101 10/07/16 1300 10/08/16 0147 10/09/16 0452 10/10/16 0450 10/10/16 2200 10/11/16 0510  WBC 3.8* 4.0 4.3 3.2* 3.8*  --  3.6*  NEUTROABS 2.5 2.9 3.1  --   --   --   --   HGB 7.1* 7.5* 7.6* 7.3* 7.0* 8.3* 8.6*  HCT 22.7* 23.4* 23.7* 22.6* 21.6* 25.0* 25.9*  MCV 95.8 95.1 93.7 93.8 92.3  --  90.9  PLT 69* 71* 82* 80* 80*  --  74*   Basic Metabolic Panel:  Recent Labs Lab 10/06/16 2101 10/07/16 1300 10/08/16 0147 10/08/16 1212 10/09/16 0452 10/10/16 0450 10/11/16 0510 10/12/16 0500  NA 127* 129* 128* 129* 128* 125* 126* 128*  K 5.8* 5.2* 5.5* 5.0 4.8 4.4 4.4 4.4  CL 91* 96* 92* 95* 91* 89* 85* 86*    CO2 23 23 23 23 24 23 25 28   GLUCOSE 116* 124* 102* 111* 91 112* 95 111*  BUN 34* 36* 40* 42* 45* 50* 54* 57*  CREATININE 3.28* 3.28* 3.58* 3.61* 3.70* 3.64* 3.64* 3.36*  CALCIUM 7.7* 7.4* 7.7* 7.6* 7.4* 7.3* 7.9* 8.2*  MG 2.2 2.3 2.3  --   --  2.1  --   --   PHOS 4.9* 4.9* 5.3*  --  5.6* 6.3* 5.6* 5.5*   GFR: Estimated Creatinine Clearance: 31.6 mL/min (by C-G formula based on SCr of 3.36 mg/dL (H)). Liver Function Tests:  Recent Labs Lab 10/06/16 2101  10/08/16 0147 10/09/16 0452 10/10/16 0450 10/11/16 0510 10/12/16 0500  AST 19  --   --   --   --   --   --   ALT 14*  --   --   --   --   --   --   ALKPHOS 85  --   --   --   --   --   --   BILITOT 1.2  --   --   --   --   --   --   PROT 11.9*  --   --   --   --   --   --  ALBUMIN 1.4*  < > 1.4* 1.5* 1.5* 1.6* 1.6*  < > = values in this interval not displayed. No results for input(s): LIPASE, AMYLASE in the last 168 hours. No results for input(s): AMMONIA in the last 168 hours. Coagulation Profile:  Recent Labs Lab 10/06/16 2101 10/11/16 1505  INR 1.97 1.96   Cardiac Enzymes:  Recent Labs Lab 10/06/16 2101 10/07/16 0455 10/07/16 1300  TROPONINI <0.03 <0.03 <0.03   BNP (last 3 results) No results for input(s): PROBNP in the last 8760 hours. HbA1C: No results for input(s): HGBA1C in the last 72 hours. CBG:  Recent Labs Lab 10/06/16 2012 10/07/16 0809 10/09/16 0827  GLUCAP 97 83 94   Lipid Profile: No results for input(s): CHOL, HDL, LDLCALC, TRIG, CHOLHDL, LDLDIRECT in the last 72 hours. Thyroid Function Tests: No results for input(s): TSH, T4TOTAL, FREET4, T3FREE, THYROIDAB in the last 72 hours. Anemia Panel: No results for input(s): VITAMINB12, FOLATE, FERRITIN, TIBC, IRON, RETICCTPCT in the last 72 hours. Sepsis Labs: No results for input(s): PROCALCITON, LATICACIDVEN in the last 168 hours.  Recent Results (from the past 240 hour(s))  Urine culture     Status: None   Collection Time: 10/07/16  10:10 PM  Result Value Ref Range Status   Specimen Description URINE, RANDOM  Final   Special Requests ADDED 0523 10/08/16  Final   Culture NO GROWTH  Final   Report Status 10/09/2016 FINAL  Final  MRSA PCR Screening     Status: None   Collection Time: 10/09/16  1:26 PM  Result Value Ref Range Status   MRSA by PCR NEGATIVE NEGATIVE Final    Comment:        The GeneXpert MRSA Assay (FDA approved for NASAL specimens only), is one component of a comprehensive MRSA colonization surveillance program. It is not intended to diagnose MRSA infection nor to guide or monitor treatment for MRSA infections.          Radiology Studies: No results found.      Scheduled Meds: . acyclovir  200 mg Oral Daily  . bortezomib SQ  1.5 mg/m2 Subcutaneous Once  . calcium acetate  667 mg Oral TID WC  . cyanocobalamin  1,000 mcg Intramuscular Daily  . dexamethasone  20 mg Intravenous Once  . dexamethasone  20 mg Intravenous Once  . furosemide  160 mg Intravenous Q6H  . mouth rinse  15 mL Mouth Rinse BID  . metolazone  10 mg Oral BID  . pantoprazole  40 mg Oral Daily  . sodium chloride flush  10-40 mL Intracatheter Q12H   Continuous Infusions: . DOPamine 2.5 mcg/kg/min (10/11/16 0626)     LOS: 6 days    Dron Tanna Furry, MD [facility] Pager [phone]  If 7PM-7AM, please contact night-coverage [email or web address] Password [facility] 10/12/2016, 3:57 PM

## 2016-10-13 DIAGNOSIS — D696 Thrombocytopenia, unspecified: Secondary | ICD-10-CM

## 2016-10-13 LAB — PROTEIN ELECTROPHORESIS, SERUM
A/G RATIO SPE: 0.3 — AB (ref 0.7–1.7)
Albumin ELP: 2.7 g/dL — ABNORMAL LOW (ref 2.9–4.4)
Alpha-1-Globulin: 0.4 g/dL (ref 0.0–0.4)
Alpha-2-Globulin: 0.6 g/dL (ref 0.4–1.0)
Beta Globulin: 7.5 g/dL — ABNORMAL HIGH (ref 0.7–1.3)
GLOBULIN, TOTAL: 8.8 g/dL — AB (ref 2.2–3.9)
Gamma Globulin: 0.4 g/dL (ref 0.4–1.8)
M-Spike, %: 6.9 g/dL — ABNORMAL HIGH
TOTAL PROTEIN ELP: 11.5 g/dL — AB (ref 6.0–8.5)

## 2016-10-13 LAB — COOXEMETRY PANEL
Carboxyhemoglobin: 1.7 % — ABNORMAL HIGH (ref 0.5–1.5)
Methemoglobin: 1.1 % (ref 0.0–1.5)
O2 Saturation: 70.1 %
TOTAL HEMOGLOBIN: 8.2 g/dL — AB (ref 12.0–16.0)

## 2016-10-13 LAB — MULTIPLE MYELOMA PANEL, SERUM
ALBUMIN SERPL ELPH-MCNC: 2.8 g/dL — AB (ref 2.9–4.4)
ALPHA 1: 0.4 g/dL (ref 0.0–0.4)
ALPHA2 GLOB SERPL ELPH-MCNC: 0.5 g/dL (ref 0.4–1.0)
Albumin/Glob SerPl: 0.4 — ABNORMAL LOW (ref 0.7–1.7)
B-Globulin SerPl Elph-Mcnc: 7.4 g/dL — ABNORMAL HIGH (ref 0.7–1.3)
Gamma Glob SerPl Elph-Mcnc: 0.3 g/dL — ABNORMAL LOW (ref 0.4–1.8)
Globulin, Total: 8.6 g/dL — ABNORMAL HIGH (ref 2.2–3.9)
IGG (IMMUNOGLOBIN G), SERUM: 4830 mg/dL — AB (ref 700–1600)
IgA: 9 mg/dL — ABNORMAL LOW (ref 61–437)
M Protein SerPl Elph-Mcnc: 7 g/dL — ABNORMAL HIGH
TOTAL PROTEIN ELP: 11.4 g/dL — AB (ref 6.0–8.5)

## 2016-10-13 LAB — RENAL FUNCTION PANEL
ALBUMIN: 1.4 g/dL — AB (ref 3.5–5.0)
Anion gap: 13 (ref 5–15)
BUN: 57 mg/dL — AB (ref 6–20)
CALCIUM: 8.4 mg/dL — AB (ref 8.9–10.3)
CO2: 28 mmol/L (ref 22–32)
CREATININE: 3.32 mg/dL — AB (ref 0.61–1.24)
Chloride: 85 mmol/L — ABNORMAL LOW (ref 101–111)
GFR calc Af Amer: 21 mL/min — ABNORMAL LOW (ref >60)
GFR, EST NON AFRICAN AMERICAN: 18 mL/min — AB (ref >60)
Glucose, Bld: 123 mg/dL — ABNORMAL HIGH (ref 65–99)
PHOSPHORUS: 5.2 mg/dL — AB (ref 2.5–4.6)
Potassium: 3.9 mmol/L (ref 3.5–5.1)
Sodium: 126 mmol/L — ABNORMAL LOW (ref 135–145)

## 2016-10-13 LAB — CBC
HEMATOCRIT: 24.1 % — AB (ref 39.0–52.0)
Hemoglobin: 8.1 g/dL — ABNORMAL LOW (ref 13.0–17.0)
MCH: 30.3 pg (ref 26.0–34.0)
MCHC: 33.6 g/dL (ref 30.0–36.0)
MCV: 90.3 fL (ref 78.0–100.0)
Platelets: 66 10*3/uL — ABNORMAL LOW (ref 150–400)
RBC: 2.67 MIL/uL — ABNORMAL LOW (ref 4.22–5.81)
RDW: 19.8 % — AB (ref 11.5–15.5)
WBC: 3.1 10*3/uL — ABNORMAL LOW (ref 4.0–10.5)

## 2016-10-13 MED ORDER — DEXAMETHASONE SODIUM PHOSPHATE 10 MG/ML IJ SOLN
20.0000 mg | Freq: Once | INTRAMUSCULAR | Status: AC
  Administered 2016-10-13: 20 mg via INTRAVENOUS
  Filled 2016-10-13: qty 2

## 2016-10-13 MED ORDER — DEXAMETHASONE SODIUM PHOSPHATE 10 MG/ML IJ SOLN
20.0000 mg | Freq: Once | INTRAMUSCULAR | Status: AC
  Administered 2016-10-14: 20 mg via INTRAVENOUS
  Filled 2016-10-13: qty 2

## 2016-10-13 MED ORDER — BORTEZOMIB CHEMO SQ INJECTION 3.5 MG (2.5MG/ML)
4.0000 mg | Freq: Once | INTRAMUSCULAR | Status: AC
  Administered 2016-10-13: 4 mg via SUBCUTANEOUS
  Filled 2016-10-13: qty 4

## 2016-10-13 MED ORDER — DOCUSATE SODIUM 100 MG PO CAPS
100.0000 mg | ORAL_CAPSULE | Freq: Two times a day (BID) | ORAL | Status: DC | PRN
  Administered 2016-10-13 – 2016-10-16 (×5): 100 mg via ORAL
  Filled 2016-10-13 (×5): qty 1

## 2016-10-13 NOTE — Progress Notes (Signed)
PROGRESS NOTE    Lance [name]  GTX:[id number] DOB: 11-May-1956 DOA: 10/06/2016 PCP: No primary care provider on file.   Brief Narrative: 61 y.o.gentleman with a history of transfusion dependent multiple myeloma, systolic heart failure with EF 35-40% in January 2018, pancytopenia, and HTN who was admitted to [facility] on 10/04/2016 for management of CHF exacerbation. The patient has required several transfusions in the past 30 days. He presented with shortness of breath and anasarca. Transferred from [facility] for nephrology consultation. At [place], BNP was 13000. He had a CT of the abdomen and pelvis without contrast that was negative for hydronephrosis and kidney stones.. Moderate ascites noted. Chest xray showed cardiomegaly, mild CHF, and small bilateral effusions. Lower extremity dopplers were negative for DVT.  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 Echocardiogram with EF of 35% with diffuse hypokinesis. -Patient is currently on IV Lasix 160 mg every 6 hourly and metolazone 10 mg oral twice a day. -Patient has significant urine output with net negative by almost 15 L since admission. -Currently on dopamine IV for inotropic support. -Monitor electrolytes, renal function -Cardiology consult appreciated.  #Acute kidney injury on chronic kidney disease likely stage III: Likely in the setting of multiple myeloma and congestive heart failure. -Serum creatinine level stable. Continue to monitor electrolytes and renal function especially while on high-dose diuretics. -Patient is currently on IV diuretics with inotropic support -Nephrology consult appreciated. Continue to monitor renal function. Avoid nephrotoxins.  #Acute hypoxic respiratory failure in the setting of CHF: -Currently on room air. Continue to monitor  #History of  multiple myeloma: Patient was followed by oncologist outpatient and was treated recently. Patient has Lambda ratio of 791. -Oncology consult following. Starting chemotherapy including Velcade, steroid, acyclovir likely today. -Monitor hemoglobin, received 2 units of red blood cell transfusion on 2/13  #Hyponatremia in the setting of congestive heart failure and diuretics (metolazone). Serum sodium level 126. Serum sodium level expected to improve with loop diuretics.  Urine electrolytes and osmolality reviewed.  # Goals of care discussion: Palliative care consult appreciated. Patient is DO NOT RESUSCITATE. Patient understand the severity of illness. I discussed with the patient and his sister at bedside.  DVT prophylaxis: SCD. No anticoagulation because of anemia and thrombocytopenia. Code Status: Full code Family Communication: Patient's sister at bedside Disposition Plan: The discharge home in 3-4 days    Consultants:   Cardiologist  Nephrologist  Palliative care  Procedures:  Chest x-ray 10/06/2016  2-D echo  10/07/2016  Renal ultrasound 10/07/2016  PICC line 10/09/2016 Antimicrobials: None Subjective: Patient was seen and examined at bedside. No new event. Continue to have good urine output. No significant clinical change. Denied chest pain, shortness of breath, nausea or vomiting.  Objective: Vitals:   10/13/16 0853 10/13/16 1210 10/13/16 1240 10/13/16 1300  BP: (!) 111/48 (!) 119/58 (!) 115/54 129/64  Pulse: 86   86  Resp:      Temp: 97.7 F (36.5 C)  98.2 F (36.8 C)   TempSrc: Oral  Oral   SpO2: 92%  100%   Weight:      Height:        Intake/Output Summary (Last 24 hours) at 10/13/16 1326 Last data filed at 10/13/16 1212  Gross per 24 hour  Intake           709.19 ml  Output  4800 ml  Net         -4090.81 ml   Filed Weights   10/11/16 0451 10/12/16 0504 10/13/16 0345  Weight: (!) 138.4 kg (305 lb 1.6 oz) 135.4 kg (298 lb 9.6 oz) 132 kg (290  lb 14.4 oz)    Examination:  General exam: Pleasant male sitting on chair, Not in distress Respiratory system: Bibasal decreased breath sound, respiratory effort normal  Cardiovascular system: S1 & S2 heard, RRR.   Gastrointestinal system: Abdomen is soft, nontender. Distended. Normal bowel sound Central nervous system: Alert and oriented. No focal neurological deficits. Extremities: Bilateral lower extremities with pitting edema and erythematous skin, no significant change. Skin: No rashes, lesions or ulcers Psychiatry: Judgement and insight appear normal. Mood & affect appropriate.     Data Reviewed: I have personally reviewed following labs and imaging studies  CBC:  Recent Labs Lab 10/06/16 2101 10/07/16 1300 10/08/16 0147 10/09/16 0452 10/10/16 0450 10/10/16 2200 10/11/16 0510 10/13/16 0456  WBC 3.8* 4.0 4.3 3.2* 3.8*  --  3.6* 3.1*  NEUTROABS 2.5 2.9 3.1  --   --   --   --   --   HGB 7.1* 7.5* 7.6* 7.3* 7.0* 8.3* 8.6* 8.1*  HCT 22.7* 23.4* 23.7* 22.6* 21.6* 25.0* 25.9* 24.1*  MCV 95.8 95.1 93.7 93.8 92.3  --  90.9 90.3  PLT 69* 71* 82* 80* 80*  --  74* 66*   Basic Metabolic Panel:  Recent Labs Lab 10/06/16 2101 10/07/16 1300 10/08/16 0147  10/09/16 0452 10/10/16 0450 10/11/16 0510 10/12/16 0500 10/13/16 0456  NA 127* 129* 128*  < > 128* 125* 126* 128* 126*  K 5.8* 5.2* 5.5*  < > 4.8 4.4 4.4 4.4 3.9  CL 91* 96* 92*  < > 91* 89* 85* 86* 85*  CO2 23 23 23   < > 24 23 25 28 28   GLUCOSE 116* 124* 102*  < > 91 112* 95 111* 123*  BUN 34* 36* 40*  < > 45* 50* 54* 57* 57*  CREATININE 3.28* 3.28* 3.58*  < > 3.70* 3.64* 3.64* 3.36* 3.32*  CALCIUM 7.7* 7.4* 7.7*  < > 7.4* 7.3* 7.9* 8.2* 8.4*  MG 2.2 2.3 2.3  --   --  2.1  --   --   --   PHOS 4.9* 4.9* 5.3*  --  5.6* 6.3* 5.6* 5.5* 5.2*  < > = values in this interval not displayed. GFR: Estimated Creatinine Clearance: 31.5 mL/min (by C-G formula based on SCr of 3.32 mg/dL (H)). Liver Function Tests:  Recent  Labs Lab 10/06/16 2101  10/09/16 0452 10/10/16 0450 10/11/16 0510 10/12/16 0500 10/13/16 0456  AST 19  --   --   --   --   --   --   ALT 14*  --   --   --   --   --   --   ALKPHOS 85  --   --   --   --   --   --   BILITOT 1.2  --   --   --   --   --   --   PROT 11.9*  --   --   --   --   --   --   ALBUMIN 1.4*  < > 1.5* 1.5* 1.6* 1.6* 1.4*  < > = values in this interval not displayed. No results for input(s): LIPASE, AMYLASE in the last 168 hours. No results for input(s): AMMONIA in the last  168 hours. Coagulation Profile:  Recent Labs Lab 10/06/16 2101 10/11/16 1505  INR 1.97 1.96   Cardiac Enzymes:  Recent Labs Lab 10/06/16 2101 10/07/16 0455 10/07/16 1300  TROPONINI <0.03 <0.03 <0.03   BNP (last 3 results) No results for input(s): PROBNP in the last 8760 hours. HbA1C: No results for input(s): HGBA1C in the last 72 hours. CBG:  Recent Labs Lab 10/06/16 2012 10/07/16 0809 10/09/16 0827  GLUCAP 97 83 94   Lipid Profile: No results for input(s): CHOL, HDL, LDLCALC, TRIG, CHOLHDL, LDLDIRECT in the last 72 hours. Thyroid Function Tests: No results for input(s): TSH, T4TOTAL, FREET4, T3FREE, THYROIDAB in the last 72 hours. Anemia Panel: No results for input(s): VITAMINB12, FOLATE, FERRITIN, TIBC, IRON, RETICCTPCT in the last 72 hours. Sepsis Labs: No results for input(s): PROCALCITON, LATICACIDVEN in the last 168 hours.  Recent Results (from the past 240 hour(s))  Urine culture     Status: None   Collection Time: 10/07/16 10:10 PM  Result Value Ref Range Status   Specimen Description URINE, RANDOM  Final   Special Requests ADDED 0523 10/08/16  Final   Culture NO GROWTH  Final   Report Status 10/09/2016 FINAL  Final  MRSA PCR Screening     Status: None   Collection Time: 10/09/16  1:26 PM  Result Value Ref Range Status   MRSA by PCR NEGATIVE NEGATIVE Final    Comment:        The GeneXpert MRSA Assay (FDA approved for NASAL specimens only), is one  component of a comprehensive MRSA colonization surveillance program. It is not intended to diagnose MRSA infection nor to guide or monitor treatment for MRSA infections.          Radiology Studies: No results found.      Scheduled Meds: . acyclovir  200 mg Oral Daily  . calcium acetate  667 mg Oral TID WC  . cyanocobalamin  1,000 mcg Intramuscular Daily  . [START ON 10/14/2016] dexamethasone  20 mg Intravenous Once  . furosemide  160 mg Intravenous Q6H  . mouth rinse  15 mL Mouth Rinse BID  . metolazone  10 mg Oral BID  . pantoprazole  40 mg Oral Daily  . sodium chloride flush  10-40 mL Intracatheter Q12H   Continuous Infusions: . DOPamine 2.5 mcg/kg/min (10/13/16 0143)     LOS: 7 days    Dron Tanna Furry, MD [facility] Pager [phone]  If 7PM-7AM, please contact night-coverage [email or web address] Password [email or web address] 10/13/2016, 1:26 PM

## 2016-10-13 NOTE — Progress Notes (Signed)
Orthopedic Tech Progress Note Patient Details:  Lance [name] 08-11-1956 [id number]  Ortho Devices Type of Ortho Device: Louretta Parma boot Ortho Device/Splint Location: Bilateral unna boots Ortho Device/Splint Interventions: Application   [name] 10/13/2016, 1:32 PM

## 2016-10-13 NOTE — Progress Notes (Signed)
Advanced Heart Failure Rounding Note   Subjective:   61 y/o male with apparent longstanding multiple myeloma (per Dr. 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   2/12 started dopamine and continued high dose diuretics.   Diuresing well, -15L. Co-ox improved and stable at 70.1. CVP is 19 this am. Weight down to 290 pounds (26 pounds since admission).   Was seen by Palliative Care and Oncology. Velcade and dexamethasone ordered, hasn't started Velcade as he wants to check on cost. Patient wishes to be a DNR.   Today, he appears comfortable, still has intermittent left upper quadrant pain at times.   Objective:    Vital Signs:   Temp:  [97.4 F (36.3 C)-98 F (36.7 C)] 97.8 F (36.6 C) (02/16 0345) Pulse Rate:  [89-91] 91 (02/16 0345) Resp:  [14-19] 14 (02/16 0345) BP: (118-129)/(61-65) 118/65 (02/16 0345) SpO2:  [92 %-97 %] 92 % (02/16 0345) Weight:  [290 lb 14.4 oz (132 kg)] 290 lb 14.4 oz (132 kg) (02/16 0345) Last BM Date: 10/10/16  Weight change: Filed Weights   10/11/16 0451 10/12/16 0504 10/13/16 0345  Weight: (!) 305 lb 1.6 oz (138.4 kg) 298 lb 9.6 oz (135.4 kg) 290 lb 14.4 oz (132 kg)    Intake/Output:   Intake/Output Summary (Last 24 hours) at 10/13/16 0713 Last data filed at 10/13/16 0346  Gross per 24 hour  Intake          1135.19 ml  Output             5100 ml  Net         -3964.81 ml     Physical Exam: CVP 19 General: Sitting in the chair, slightly dyspneic with talking.  HEENT: normal, R periorbital eccyhmosis Neck: supple.  JVP to ear . Carotids 2+ bilat; no bruits. No lymphadenopathy or thryomegaly appreciated. Cor: PMI nondisplaced. Regular rate & rhythm. No rubs, gallops 2/6 TR Lungs: Decreased throughout , on room air  Abdomen: obese, soft, nontender, distended. No  hepatosplenomegaly. No bruits or masses. Good bowel sounds. Extremities: no cyanosis, clubbing, rash, R and LLE 2-3+ edema R and LLE erythema noted anterior aspect of both LEs  Neuro: alert & orientedx3, cranial nerves grossly intact. moves all 4 extremities w/o difficulty. Affect pleasant  Telemetry: NSR 90s   Labs: Basic Metabolic Panel:  Recent Labs Lab 10/06/16 2101 10/07/16 1300 10/08/16 0147  10/09/16 0452 10/10/16 0450 10/11/16 0510 10/12/16 0500 10/13/16 0456  NA 127* 129* 128*  < > 128* 125* 126* 128* 126*  K 5.8* 5.2* 5.5*  < > 4.8 4.4 4.4 4.4 3.9  CL 91* 96* 92*  < > 91* 89* 85* 86* 85*  CO2 23 23 23   < > 24 23 25 28 28   GLUCOSE 116* 124* 102*  < > 91 112* 95 111* 123*  BUN 34* 36* 40*  < > 45* 50* 54* 57* 57*  CREATININE 3.28* 3.28* 3.58*  < > 3.70* 3.64* 3.64* 3.36* 3.32*  CALCIUM 7.7* 7.4* 7.7*  < > 7.4* 7.3* 7.9* 8.2* 8.4*  MG 2.2 2.3 2.3  --   --  2.1  --   --   --   PHOS 4.9* 4.9* 5.3*  --  5.6* 6.3* 5.6* 5.5* 5.2*  < > = values in this interval not displayed.  Liver Function  Tests:  Recent Labs Lab 10/06/16 2101  10/09/16 0452 10/10/16 0450 10/11/16 0510 10/12/16 0500 10/13/16 0456  AST 19  --   --   --   --   --   --   ALT 14*  --   --   --   --   --   --   ALKPHOS 85  --   --   --   --   --   --   BILITOT 1.2  --   --   --   --   --   --   PROT 11.9*  --   --   --   --   --   --   ALBUMIN 1.4*  < > 1.5* 1.5* 1.6* 1.6* 1.4*  < > = values in this interval not displayed.  CBC:  Recent Labs Lab 10/06/16 2101 10/07/16 1300 10/08/16 0147 10/09/16 0452 10/10/16 0450 10/10/16 2200 10/11/16 0510 10/13/16 0456  WBC 3.8* 4.0 4.3 3.2* 3.8*  --  3.6* 3.1*  NEUTROABS 2.5 2.9 3.1  --   --   --   --   --   HGB 7.1* 7.5* 7.6* 7.3* 7.0* 8.3* 8.6* 8.1*  HCT 22.7* 23.4* 23.7* 22.6* 21.6* 25.0* 25.9* 24.1*  MCV 95.8 95.1 93.7 93.8 92.3  --  90.9 90.3  PLT 69* 71* 82* 80* 80*  --  74* 66*    Cardiac Enzymes:  Recent Labs Lab 10/06/16 2101  10/07/16 0455 10/07/16 1300  TROPONINI <0.03 <0.03 <0.03    BNP: BNP (last 3 results)  Recent Labs  10/06/16 2101  BNP 1,535.9*    Medications:     Scheduled Medications: . acyclovir  200 mg Oral Daily  . bortezomib SQ  1.5 mg/m2 Subcutaneous Once  . calcium acetate  667 mg Oral TID WC  . cyanocobalamin  1,000 mcg Intramuscular Daily  . dexamethasone  20 mg Intravenous Once  . dexamethasone  20 mg Intravenous Once  . furosemide  160 mg Intravenous Q6H  . mouth rinse  15 mL Mouth Rinse BID  . metolazone  10 mg Oral BID  . pantoprazole  40 mg Oral Daily  . sodium chloride flush  10-40 mL Intracatheter Q12H    Infusions: . DOPamine 2.5 mcg/kg/min (10/13/16 0143)    PRN Medications: acetaminophen **OR** acetaminophen, calcium carbonate (dosed in mg elemental calcium), camphor-menthol **AND** hydrOXYzine, docusate sodium, feeding supplement (NEPRO CARB STEADY), ipratropium-albuterol, ondansetron **OR** ondansetron (ZOFRAN) IV, oxyCODONE, sodium chloride flush, sorbitol, traZODone, zolpidem   Assessment:   1. Acute Biventricular Heart Failure 2. Acute on chronic Renal Failure 3. Anemia 4. Multiple Myeloma 5. Hyponatremia   Plan/Discussion:    Continues to appear volume overload on exam, but responding very well to high-dose lasix, metolazone and dopamine support. CVP coming down, 19 this am.   Creatinine improving, 3.36->3.32. Continues to be hyponatremic. Hemoglobin stable at 8.1 (got 2 units of PRBC's on 10/10/16 for hbg of 7.0).   Co-ox stable at 70.1 today. Continue dopamine.   Continues to be anxious about cost and his financial responsibilities.   Length of Stay: 7  Arbutus Leas MD  10/13/2016, 7:13 AM  Advanced Heart Failure Team Please contact [facility] for night-coverage after hours (4p -7a ) and weekends on [email or web address] Dani Gobble Smith,MD 7:13 AM   Patient seen and examined with Jettie Booze, NP. We discussed all aspects of the encounter. I agree  with the assessment and plan as stated above.  He continues to diurese well on high-dose lasix, metolazone and dopamine. CVP remains elevated. Co-ox ok. Creatinine has stabilized. Will continue diuresis.   Remains concerned about cost of Velcade.    I explained to him that while treating myeloma can help stop progression of amyloidosis it will not reverse the damage to his heart and kidneys that has already been done. We will continue to diurese on current regimen. It remains to be seen if we will be able to keep the fluid off once we turn off dopamine and get him back on oral diuretics.   Bensimhon, Daniel,MD 6:19 PM

## 2016-10-13 NOTE — Progress Notes (Signed)
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 acyclovir  200 mg Oral Daily  . bortezomib SQ  4 mg Subcutaneous Once  . calcium acetate  667 mg Oral TID WC  . cyanocobalamin  1,000 mcg Intramuscular Daily  . dexamethasone  20 mg Intravenous Once  . [START ON 10/14/2016] dexamethasone  20 mg Intravenous Once  . furosemide  160 mg Intravenous Q6H  . mouth rinse  15 mL Mouth Rinse BID  . metolazone  10 mg Oral BID  . pantoprazole  40 mg Oral Daily  . sodium chloride flush  10-40 mL Intracatheter Q12H   Continuous Infusions: . DOPamine 2.5 mcg/kg/min (10/13/16 0143)   PRN Meds:.acetaminophen **OR** acetaminophen, calcium carbonate (dosed in mg elemental calcium), camphor-menthol **AND** hydrOXYzine, docusate sodium, feeding supplement (NEPRO CARB STEADY), ipratropium-albuterol, ondansetron **OR** ondansetron (ZOFRAN) IV, oxyCODONE, sodium chloride flush, sorbitol, traZODone, zolpidem  Current Labs: reviewed    Physical Exam:  Blood pressure (!) 111/48, pulse 86, temperature 97.7 F (36.5 C), temperature source Oral, resp. rate 18, height 5\' 10"  (1.778 m), weight 132 kg (290 lb 14.4 oz), SpO2 92 %. Obese, NAD Massive edema/anasarca Chronic venous stasis changes RRR CTAB Protuberant, limited abd exam AAO x3 Nonfocal  A 1. AKI and unclear chronicity; renal US normal sized kidneys w/o obstruction 2/10 2. Myeloma, recent start CTX Dr. Roni Bread 1. K:L ration 791, absolute Kappa 2689 2. SPEP pending 3. Oncology  following inpatient 3. 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 Morbid obesity 5. HTN including low dose ACEi at home; held 6. Hypervolemia 7. Hypervolemic Hyponatremia  P 1. Cont diuretics, inotrope; tolerating well 2. Cont medical management, no RRT at this poitn 3. Cont Na and fluid restriction   Pearson Grippe MD 10/13/2016, 10:53 AM   Recent Labs Lab 10/11/16 0510 10/12/16 0500 10/13/16 0456  NA 126* 128* 126*  K 4.4 4.4 3.9  CL 85* 86* 85*  CO2 25 28 28   GLUCOSE 95 111* 123*  BUN 54* 57* 57*  CREATININE 3.64* 3.36* 3.32*  CALCIUM 7.9* 8.2* 8.4*  PHOS 5.6* 5.5* 5.2*    Recent Labs Lab 10/06/16 2101 10/07/16 1300 10/08/16 0147  10/10/16 0450 10/10/16 2200 10/11/16 0510 10/13/16 0456  WBC 3.8* 4.0 4.3  < > 3.8*  --  3.6* 3.1*  NEUTROABS 2.5 2.9 3.1  --   --   --   --   --   HGB 7.1* 7.5* 7.6*  < > 7.0* 8.3* 8.6* 8.1*  HCT 22.7* 23.4* 23.7*  < > 21.6* 25.0* 25.9* 24.1*  MCV 95.8 95.1 93.7  < > 92.3  --  90.9 90.3  PLT 69* 71* 82*  < > 80*  --  74* 66*  < > = values in this interval not displayed.

## 2016-10-13 NOTE — Progress Notes (Signed)
PT Cancellation Note  Patient Details Name: Lance [name] MRN: [id number] DOB: [birth year]   Cancelled Treatment:     Order received for PT eval and treat, Spoke with nursing who reports the patient is to receive a Chemo-Therapy drug soon and hopefully have unna boots applied by ortho. Nursing request therapy return in the afternoon. Therapy will do so if time permits. If not therapy will follow up on 10/14/2016.    Carney Living 10/13/2016, 12:50 PM

## 2016-10-13 NOTE — Progress Notes (Addendum)
Palliative care encounter.  Reason for visit: Goals of care in light of biventricular heart failure, multiple myeloma and renal failure  I stopped by briefly to meet with Lance [name].  His sister was also present in the room.    He reports feeling "fair" today.  We reviewed his clinical course including continued diuresis and plan for treatment of myeloma. Discussed that, while this is encouraging short term improvement, he remains very ill overall.    General: Alert, awake but appears tired, sitting in chair in no acute distress.  HEENT: No bruits, no goiter, no JVD Heart: Regular rate and rhythm. No murmur appreciated. Lungs: Good air movement, scattered wheezes Abdomen:  distended, soft.  Ext: + edema Skin: Warm and dry  Plan to continue current therapies.   Palliative to continue to follow. If there are needs over weekend, please call and I would be happy to see Lance [name].  Otherwise, palliative will likely f/u again on Monday.  Total time: 20 minutes Greater than 50%  of this time was spent counseling and coordinating care related to the above assessment and plan.  Micheline Rough, MD [place] Team [phone]

## 2016-10-14 LAB — NA AND K (SODIUM & POTASSIUM), RAND UR
Potassium Urine: 14 mmol/L
Sodium, Ur: 81 mmol/L

## 2016-10-14 LAB — RENAL FUNCTION PANEL
ANION GAP: 17 — AB (ref 5–15)
Albumin: 1.5 g/dL — ABNORMAL LOW (ref 3.5–5.0)
BUN: 67 mg/dL — ABNORMAL HIGH (ref 6–20)
CALCIUM: 8.9 mg/dL (ref 8.9–10.3)
CO2: 29 mmol/L (ref 22–32)
Chloride: 78 mmol/L — ABNORMAL LOW (ref 101–111)
Creatinine, Ser: 3.39 mg/dL — ABNORMAL HIGH (ref 0.61–1.24)
GFR calc non Af Amer: 18 mL/min — ABNORMAL LOW (ref >60)
GFR, EST AFRICAN AMERICAN: 21 mL/min — AB (ref >60)
Glucose, Bld: 142 mg/dL — ABNORMAL HIGH (ref 65–99)
Phosphorus: 5.6 mg/dL — ABNORMAL HIGH (ref 2.5–4.6)
Potassium: 4.4 mmol/L (ref 3.5–5.1)
SODIUM: 124 mmol/L — AB (ref 135–145)

## 2016-10-14 LAB — COOXEMETRY PANEL
CARBOXYHEMOGLOBIN: 1.7 % — AB (ref 0.5–1.5)
Methemoglobin: 1.2 % (ref 0.0–1.5)
O2 SAT: 83.3 %
Total hemoglobin: 9 g/dL — ABNORMAL LOW (ref 12.0–16.0)

## 2016-10-14 LAB — OSMOLALITY, URINE: OSMOLALITY UR: 278 mosm/kg — AB (ref 300–900)

## 2016-10-14 LAB — CBC
HCT: 25.8 % — ABNORMAL LOW (ref 39.0–52.0)
Hemoglobin: 8.7 g/dL — ABNORMAL LOW (ref 13.0–17.0)
MCH: 30.2 pg (ref 26.0–34.0)
MCHC: 33.7 g/dL (ref 30.0–36.0)
MCV: 89.6 fL (ref 78.0–100.0)
Platelets: 66 10*3/uL — ABNORMAL LOW (ref 150–400)
RBC: 2.88 MIL/uL — ABNORMAL LOW (ref 4.22–5.81)
RDW: 18.9 % — AB (ref 11.5–15.5)
WBC: 5.2 10*3/uL (ref 4.0–10.5)

## 2016-10-14 MED ORDER — BORTEZOMIB CHEMO SQ INJECTION 3.5 MG (2.5MG/ML)
1.3000 mg/m2 | Freq: Once | INTRAMUSCULAR | Status: AC
  Administered 2016-10-17: 3.25 mg via SUBCUTANEOUS
  Filled 2016-10-14: qty 3.25

## 2016-10-14 MED ORDER — METOLAZONE 5 MG PO TABS: 10.0000 mg | ORAL_TABLET | Freq: Every day | ORAL | Status: DC

## 2016-10-14 MED ORDER — DEXAMETHASONE SODIUM PHOSPHATE 4 MG/ML IJ SOLN: 20.0000 mg | Freq: Every day | INTRAMUSCULAR | Status: DC

## 2016-10-14 MED ORDER — DEXAMETHASONE SODIUM PHOSPHATE 10 MG/ML IJ SOLN
INTRAMUSCULAR | Status: AC
  Administered 2016-10-14: 20 mg via INTRAVENOUS
  Filled 2016-10-14: qty 2

## 2016-10-14 NOTE — Progress Notes (Signed)
Physical Therapy Evaluation Patient Details Name: Lance [name] MRN: [id number] DOB: 01-28-1956 Today's Date: 10/14/2016   History of Present Illness  Patient presents wo the hospital with acute respiratory failure. PMH: Multpile Myeloma,  HTN, depression, dilated cardiomyopathy   Clinical Impression  Patient presents with limited endurance at this time. He ambulated 6' with 3 standing rest breaks. He required min guard for all  Transfers. He may benefit from rehab at a SNF but feels like he will be able to go home with family support. He would benefit from further skilled acute PT as well as home health PT at discharge.     Follow Up Recommendations Home health PT;Supervision/Assistance - 24 hour    Equipment Recommendations  Rolling walker with 5" wheels    Recommendations for Other Services       Precautions / Restrictions Precautions Precautions: Fall;Other (comment) (chemotherapy ) Restrictions Weight Bearing Restrictions: No      Mobility  Bed Mobility               General bed mobility comments: Patient forund in a chair. Per nnursing min a to sit up.   Transfers Overall transfer level: Needs assistance   Transfers: Sit to/from Stand Sit to Stand: Min guard         General transfer comment: Patient reported minor syncope upon standing  Ambulation/Gait Ambulation/Gait assistance: Min guard Ambulation Distance (Feet): 50 Feet Assistive device: Rolling walker (2 wheeled)     Gait velocity interpretation: Below normal speed for age/gender General Gait Details: Patient required 3 standing rest breaks 2nd to fatigue   Stairs            Wheelchair Mobility    Modified Rankin (Stroke Patients Only)       Balance Overall balance assessment: Needs assistance Sitting-balance support: No upper extremity supported Sitting balance-Leahy Scale: Good     Standing balance support: Bilateral upper extremity supported Standing balance-Leahy  Scale: Good                               Pertinent Vitals/Pain Pain Assessment: No/denies pain    Home Living Family/patient expects to be discharged to:: Private residence Living Arrangements: Alone Available Help at Discharge: Family             Additional Comments: Patient has a sister and a brother to assist at home     Prior Function Level of Independence: Independent with assistive device(s)         Comments: used a cane. Has a walker      Hand Dominance   Dominant Hand: Right    Extremity/Trunk Assessment   Upper Extremity Assessment Upper Extremity Assessment: Overall WFL for tasks assessed    Lower Extremity Assessment Lower Extremity Assessment: Generalized weakness       Communication   Communication: No difficulties  Cognition Arousal/Alertness: Awake/alert Behavior During Therapy: WFL for tasks assessed/performed Overall Cognitive Status: Within Functional Limits for tasks assessed                      General Comments General comments (skin integrity, edema, etc.): Bilateral una boots     Exercises     Assessment/Plan    PT Assessment Patient needs continued PT services  PT Problem List Decreased strength;Decreased range of motion;Decreased activity tolerance;Decreased mobility;Decreased knowledge of use of DME          PT Treatment Interventions Functional  mobility training;DME instruction;Gait training;Therapeutic activities;Therapeutic exercise;Patient/family education    PT Goals (Current goals can be found in the Care Plan section)  Acute Rehab PT Goals Patient Stated Goal: to go home  PT Goal Formulation: With patient Time For Goal Achievement: 10/28/16 Potential to Achieve Goals: Good    Frequency Min 3X/week   Barriers to discharge   Has family members around to assist if needed     Co-evaluation               End of Session Equipment Utilized During Treatment: Gait belt Activity  Tolerance: Patient limited by fatigue Patient left: in chair;with call bell/phone within reach;with family/visitor present Nurse Communication: Mobility status         Time: [id number] PT Time Calculation (min) (ACUTE ONLY): 28 min   Charges:   PT Evaluation $PT Eval Moderate Complexity: 1 Procedure     PT G Codes:        Carney Living PT DPT  10/14/2016, 10:58 AM

## 2016-10-14 NOTE — Progress Notes (Signed)
Marland Kitchen   HEMATOLOGY/ONCOLOGY INPATIENT PROGRESS NOTE  Date of Service: 10/14/2016  Inpatient Attending: .Dron Tanna Furry, MD   SUBJECTIVE  Patient knows that his breathing is somewhat improved and his neck swelling is decreasing.Diuresing well with Lasix drip and on dopamine support. Is down about 36 pounds since admission. Tolerated Velcade and dexamethasone without acute issues. We discussed that he might go to day 1, 4, 8 and 11 Velcade with weekly dexamethasone. No overt bleeding.    OBJECTIVE:  NAD  PHYSICAL EXAMINATION: . Vitals:   10/13/16 2108 10/14/16 0014 10/14/16 0455 10/14/16 0723  BP: 133/67 (!) 122/49 (!) 118/57 118/79  Pulse: 84 86 88 88  Resp: _0 Temp: 97.8 F (36.6 C) 98 F (36.7 C) 98.4 F (36.9 C)   TempSrc:      SpO2: 97% 93% (!) 84% 93%  Weight:   280 lb 3.2 oz (127.1 kg)   Height:       Filed Weights   10/12/16 0504 10/13/16 0345 10/14/16 0455  Weight: 298 lb 9.6 oz (135.4 kg) 290 lb 14.4 oz (132 kg) 280 lb 3.2 oz (127.1 kg)   .Body mass index is 40.2 kg/m.   GENERAL:alert, not as short of breath at rest SKIN:Significant petechia and ecchymosis on his extremities and right-sided black eye EYES: conjunctival pallor , anicteric sclera , rt periorbital ecchymosis OROPHARYNX: no exudate, no erythema and lips, buccal mucosa, and tongue normal  NECK: JVD+ LYMPH:  no palpable lymphadenopathy in the cervical, axillary or inguinal LUNGS: b/l basal rales HEART:  B/l LE 4+ pedal edema (now in ACE wraps) - decreased pedal edema ABDOMEN: obese no palpable hepatosplenomegaly PSYCH: alert & oriented x 3 NEURO: no focal motor/sensory deficits  MEDICAL HISTORY:  Past Medical History:  Diagnosis Date  . Depression   . Dilated cardiomyopathy ([place])   . Diverticulosis   . Hypertension   . Iron deficiency anemia   . Multiple myeloma ([place])   . Pancytopenia ([place])     SURGICAL HISTORY: Past Surgical History:  Procedure Laterality Date  .  INGUINAL HERNIA REPAIR Right     SOCIAL HISTORY: Social History   Social History  . Marital status: Unknown    Spouse name: N/A  . Number of children: N/A  . Years of education: N/A   Occupational History  . Not on file.   Social History Main Topics  . Smoking status: Never Smoker  . Smokeless tobacco: Never Used  . Alcohol use No  . Drug use: No  . Sexual activity: Not on file   Other Topics Concern  . Not on file   Social History Narrative  . No narrative on file    FAMILY HISTORY: Family History  Problem Relation Age of Onset  . Hypertension Mother   . Diabetes Mother   . Bone cancer Mother   . Diabetes Father   . Prostate cancer Father   . Lung cancer Sister   . Brain cancer Brother     ALLERGIES:  is allergic to morphine and related; penicillins; and tobacco [nicotiana tabacum].  MEDICATIONS:  Scheduled Meds: . acyclovir  200 mg Oral Daily  . calcium acetate  667 mg Oral TID WC  . cyanocobalamin  1,000 mcg Intramuscular Daily  . dexamethasone  20 mg Intravenous Once  . furosemide  160 mg Intravenous Q6H  . mouth rinse  15 mL Mouth Rinse BID  . metolazone  10 mg Oral BID  . pantoprazole  40 mg Oral  Daily  . sodium chloride flush  10-40 mL Intracatheter Q12H   Continuous Infusions: . DOPamine 2.5 mcg/kg/min (10/13/16 0143)   PRN Meds:.acetaminophen **OR** acetaminophen, calcium carbonate (dosed in mg elemental calcium), camphor-menthol **AND** hydrOXYzine, docusate sodium, docusate sodium, feeding supplement (NEPRO CARB STEADY), ipratropium-albuterol, ondansetron **OR** ondansetron (ZOFRAN) IV, oxyCODONE, sodium chloride flush, sorbitol, traZODone, zolpidem  REVIEW OF SYSTEMS:    10 Point review of Systems was done is negative except as noted above.   LABORATORY DATA:  I have reviewed the data as listed  . CBC Latest Ref Rng & Units 10/14/2016 10/13/2016 10/11/2016  WBC 4.0 - 10.5 K/uL 5.2 3.1(L) 3.6(L)  Hemoglobin 13.0 - 17.0 g/dL 8.7(L) 8.1(L)  8.6(L)  Hematocrit 39.0 - 52.0 % 25.8(L) 24.1(L) 25.9(L)  Platelets 150 - 400 K/uL 66(L) 66(L) 74(L)    . CMP Latest Ref Rng & Units 10/14/2016 10/13/2016 10/12/2016  Glucose 65 - 99 mg/dL 142(H) 123(H) 111(H)  BUN 6 - 20 mg/dL 67(H) 57(H) 57(H)  Creatinine 0.61 - 1.24 mg/dL 3.39(H) 3.32(H) 3.36(H)  Sodium 135 - 145 mmol/L 124(L) 126(L) 128(L)  Potassium 3.5 - 5.1 mmol/L 4.4 3.9 4.4  Chloride 101 - 111 mmol/L 78(L) 85(L) 86(L)  CO2 22 - 32 mmol/L _0 Calcium 8.9 - 10.3 mg/dL 8.9 8.4(L) 8.2(L)  Total Protein 6.5 - 8.1 g/dL - - -  Total Bilirubin 0.3 - 1.2 mg/dL - - -  Alkaline Phos 38 - 126 U/L - - -  AST 15 - 41 U/L - - -  ALT 17 - 63 U/L - - -       Component     Latest Ref Rng & Units 10/11/2016  Prothrombin Time     11.4 - 15.2 seconds 22.6 (H)  INR      1.96  APTT     24 - 36 seconds 73 (H)  Fibrinogen     210 - 475 mg/dL 237  Factor X Activity     76 - 183 % 52 (L)   RADIOGRAPHIC STUDIES: I have personally reviewed the radiological images as listed and agreed with the findings in the report. US Renal  Result Date: 10/07/2016 CLINICAL DATA:  Acute renal failure EXAM: RENAL / URINARY TRACT ULTRASOUND COMPLETE COMPARISON:  None. FINDINGS: Right Kidney: Length: 12.2 cm. Echogenicity within normal limits. No mass or hydronephrosis visualized. Left Kidney: Length: 12.7 cm. Echogenicity within normal limits. No mass or hydronephrosis visualized. Bladder: The bladder is decompressed with a Foley catheter. Ascites is seen in the abdomen. IMPRESSION: 1. Limited views of the kidneys due to patient body habitus. No acute abnormality. 2. Ascites. Electronically Signed   By: Dorise Bullion III M.D   On: 10/07/2016 21:29   Portable Chest 1 View  Result Date: 10/06/2016 CLINICAL DATA:  Chest pain, history of pneumonia and wheezing. EXAM: PORTABLE CHEST 1 VIEW COMPARISON:  10/04/2016 FINDINGS: There is stable cardiomegaly. No aortic aneurysm. Mild vascular congestion consistent  with CHF. Small bilateral pleural effusions with trace fluid in the right minor fissure. Subsegmental atelectasis in the left mid lung versus scarring. Bibasilar densities cannot exclude superimposed pneumonia. No acute osseous abnormality. IMPRESSION: Stable cardiomegaly with mild CHF. Superimposed bibasilar pneumonia would be difficult to entirely exclude. Trace bilateral pleural effusions. Electronically Signed   By: Ashley Royalty M.D.   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 Certainly possible concurrently with his obvious diagnosis of multiple myeloma. Cannot be definitively proven without a fat pad biopsy or endomyocardial biopsy -- which would not be recommended since it would not change treatment options at this time.  3) Acute renal failure - likely due to his multiple myeloma. 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 Could also could be myeloma kidney. creatinine relatively stable.  4) Pancytopenia   due to multiple myeloma. Patient is currently transfusion dependent and has needed about 10 units of PRBCs in the last 4-6 weeks.part of his anemia is also due to blood loss from intermittent rectal bleeding and hematuria .  also has thrombocytopenia in the 60-70k range. Pancytopenia will not improve without treatment of his  multiple myeloma. Hgb relatively stable in the mid 8-9 range.  5) Coagulopathy. Patient has elevated PT and PTT. The setting of multiple myeloma this could be due to his paraproteinemia affecting febrile polymerization. Noted to have have some deficiency in FX as is seen with amyloidosis  6) Significant Hyponatremia - multifactorial -- fluid overload from CHF and renal failure. Also element of pseudohyponatremia from his paraproteinemia.  Plan -Diuresis and cardiac optimization as per nephrology and cardiology -PRBC transfusion for HGB<8 or if bleeding -PLT transfusion for PLT,20K or if bleeding (considering associated coagulopathy) -FFP if uncontrolled bleeding and fluid status allows. -Patient is relatively treatment naive for multiple myeloma and would be assumed to be treatment responsive if his overall health status allows for adequate treatment. -patient had received weekly Velcade by Dr Vista Lawman for 2-3 weeks. Did not take his dexamethasone or Revlimid. Not a good candidate for Revlimid currently due to renal function. -will plan to do Velcade D1,4,8 and 11 (next dose 2/20) with weekly Dexamethason (next dose 2/23). -will need to consider adding Cytoxan and go CyBorD for next cycle. -continue Prophylactic Acyclovir -will need to transition cares to Dr Bobby Rumpf for oncologic cares on discharge will treat while in the hospital.    I spent 30 minutes counseling the patient face to face. The total time spent in the appointment was 40 minutes and more than 50% was on counseling and direct patient cares.    Sullivan Lone MD [place] AAHIVMS [facility] Hematology/Oncology Physician [facility]  (Office):       [phone] (Work cell):  [phone] (Fax):           [phone]  10/14/2016 9:31 AM

## 2016-10-14 NOTE — Progress Notes (Signed)
PROGRESS NOTE    Lance [name]  MRN:[id number] DOB: 08/03/1956 DOA: 10/06/2016 PCP: No primary care provider on file.   Brief Narrative: 60 y.o.gentleman with a history of transfusion dependent multiple myeloma, systolic heart failure with EF 35-40% in January 2018, pancytopenia, and HTN who was admitted to [place] hospital on 10/04/2016 for management of CHF exacerbation. The patient has required several transfusions in the past 30 days. He presented with shortness of breath and anasarca. Transferred from [place] for nephrology consultation. At [place], BNP was 13000. He had a CT of the abdomen and pelvis without contrast that was negative for hydronephrosis and kidney stones.. Moderate ascites noted. Chest xray showed cardiomegaly, mild CHF, and small bilateral effusions. Lower extremity dopplers were negative for DVT.  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 Echocardiogram with EF of 35% with diffuse hypokinesis. -Patient is currently on IV Lasix 160 mg every 6 hourly and metolazone. I reduced metolazone to once a day because of worsening hyponatremia. -Patient has significant urine output with net negative by almost 20 L since admission. Patient lost about 36 pound weight since admission. -Currently on dopamine IV for inotropic support. -Monitor electrolytes, renal function -Cardiology consult appreciated.  #Acute kidney injury on chronic kidney disease likely stage III: Likely in the setting of multiple myeloma and congestive heart failure. -Serum creatinine level stable. Continue to monitor electrolytes and renal function especially while on high-dose diuretics. -Patient is currently on IV diuretics with inotropic support -Nephrology consult appreciated. Continue to monitor renal function. Avoid nephrotoxins. -Discussed with the  nephrologist at bedside.  #Acute hypoxic respiratory failure in the setting of CHF: - Continue to monitor. Requiring about 2 L of oxygen.  #History of multiple myeloma: Patient was followed by oncologist outpatient and was treated recently. Patient has Lambda ratio of 791. -Started chemotherapy including Velcade, steroid, acyclovir from 2/16. -Monitor hemoglobin, received 2 units of red blood cell transfusion on 2/13 -Oncology consult appreciated.  #Hyponatremia in the setting of congestive heart failure and diuretics (metolazone). Serum sodium level 124.  -Check urine electrolytes and urine osmolality. -I reduced the dose of metolazone to once a day.  # Goals of care discussion: Palliative care consult appreciated. Patient is DO NOT RESUSCITATE. Patient understand the severity of illness.   DVT prophylaxis: SCD. No anticoagulation because of anemia and thrombocytopenia. Code Status: Full code Family Communication: Patient's brother at bedside Disposition Plan: Likely discharge home in 3-4 days.    Consultants:   Cardiologist  Nephrologist  Palliative care  Oncologist  Procedures:  Chest x-ray 10/06/2016  2-D echo  10/07/2016  Renal ultrasound 10/07/2016  PICC line 10/09/2016 Antimicrobials: None Subjective: Patient was seen and examined at bedside. No new event. Continues to diurese well. Denied chest pain, shortness of breath, nausea or vomiting. Has weakness.  Objective: Vitals:   10/13/16 2108 10/14/16 0014 10/14/16 0455 10/14/16 0723  BP: 133/67 (!) 122/49 (!) 118/57 118/79  Pulse: 84 86 88 88  Resp: 19 16 16 18  Temp: 97.8 F (36.6 C) 98 F (36.7 C) 98.4 F (36.9 C)   TempSrc:      SpO2: 97% 93% (!) 84% 93%  Weight:   127.1 kg (280 lb 3.2 oz)   Height:        Intake/Output Summary (Last 24 hours) at 10/14/16 1156 Last data filed at 10/14/16 1000  Gross per 24 hour  Intake            1302.68 ml  Output             5250 ml  Net         -3947.32 ml     Filed Weights   10/12/16 0504 10/13/16 0345 10/14/16 0455  Weight: 135.4 kg (298 lb 9.6 oz) 132 kg (290 lb 14.4 oz) 127.1 kg (280 lb 3.2 oz)    Examination:  General exam: Wasn't male sitting on chair, not in distress Respiratory system: Bibasal decreased breath sound, respiratory effort normal Cardiovascular system: Regular rate rhythm, S1-S2 normal..   Gastrointestinal system: Abdomen is soft, nontender. Distended. Normal bowel sound Central nervous system: Alert and oriented. No focal neurological deficits. Extremities: Bilateral lower extremities was wrapped Skin: No rashes, lesions or ulcers Psychiatry: Judgement and insight appear normal. Mood & affect appropriate.     Data Reviewed: I have personally reviewed following labs and imaging studies  CBC:  Recent Labs Lab 10/07/16 1300 10/08/16 0147 10/09/16 0452 10/10/16 0450 10/10/16 2200 10/11/16 0510 10/13/16 0456 10/14/16 0530  WBC 4.0 4.3 3.2* 3.8*  --  3.6* 3.1* 5.2  NEUTROABS 2.9 3.1  --   --   --   --   --   --   HGB 7.5* 7.6* 7.3* 7.0* 8.3* 8.6* 8.1* 8.7*  HCT 23.4* 23.7* 22.6* 21.6* 25.0* 25.9* 24.1* 25.8*  MCV 95.1 93.7 93.8 92.3  --  90.9 90.3 89.6  PLT 71* 82* 80* 80*  --  74* 66* 66*   Basic Metabolic Panel:  Recent Labs Lab 10/07/16 1300 10/08/16 0147  10/10/16 0450 10/11/16 0510 10/12/16 0500 10/13/16 0456 10/14/16 0530  NA 129* 128*  < > 125* 126* 128* 126* 124*  K 5.2* 5.5*  < > 4.4 4.4 4.4 3.9 4.4  CL 96* 92*  < > 89* 85* 86* 85* 78*  CO2 23 23  < > _0 GLUCOSE 124* 102*  < > 112* 95 111* 123* 142*  BUN 36* 40*  < > 50* 54* 57* 57* 67*  CREATININE 3.28* 3.58*  < > 3.64* 3.64* 3.36* 3.32* 3.39*  CALCIUM 7.4* 7.7*  < > 7.3* 7.9* 8.2* 8.4* 8.9  MG 2.3 2.3  --  2.1  --   --   --   --   PHOS 4.9* 5.3*  < > 6.3* 5.6* 5.5* 5.2* 5.6*  < > = values in this interval not displayed. GFR: Estimated Creatinine Clearance: 30.2 mL/min (by C-G formula based on SCr of 3.39 mg/dL  (H)). Liver Function Tests:  Recent Labs Lab 10/10/16 0450 10/11/16 0510 10/12/16 0500 10/13/16 0456 10/14/16 0530  ALBUMIN 1.5* 1.6* 1.6* 1.4* 1.5*   No results for input(s): LIPASE, AMYLASE in the last 168 hours. No results for input(s): AMMONIA in the last 168 hours. Coagulation Profile:  Recent Labs Lab 10/11/16 1505  INR 1.96   Cardiac Enzymes:  Recent Labs Lab 10/07/16 1300  TROPONINI <0.03   BNP (last 3 results) No results for input(s): PROBNP in the last 8760 hours. HbA1C: No results for input(s): HGBA1C in the last 72 hours. CBG:  Recent Labs Lab 10/09/16 0827  GLUCAP 94   Lipid Profile: No results for input(s): CHOL, HDL, LDLCALC, TRIG, CHOLHDL, LDLDIRECT in the last 72 hours. Thyroid Function Tests: No results for input(s): TSH, T4TOTAL, FREET4, T3FREE, THYROIDAB in the last 72 hours. Anemia Panel: No results for input(s): VITAMINB12, FOLATE, FERRITIN, TIBC, IRON, RETICCTPCT in the last 72 hours. Sepsis Labs: No results for input(s):  PROCALCITON, LATICACIDVEN in the last 168 hours.  Recent Results (from the past 240 hour(s))  Urine culture     Status: None   Collection Time: 10/07/16 10:10 PM  Result Value Ref Range Status   Specimen Description URINE, RANDOM  Final   Special Requests ADDED 0523 10/08/16  Final   Culture NO GROWTH  Final   Report Status 10/09/2016 FINAL  Final  MRSA PCR Screening     Status: None   Collection Time: 10/09/16  1:26 PM  Result Value Ref Range Status   MRSA by PCR NEGATIVE NEGATIVE Final    Comment:        The GeneXpert MRSA Assay (FDA approved for NASAL specimens only), is one component of a comprehensive MRSA colonization surveillance program. It is not intended to diagnose MRSA infection nor to guide or monitor treatment for MRSA infections.          Radiology Studies: No results found.      Scheduled Meds: . acyclovir  200 mg Oral Daily  . [START ON 10/17/2016] bortezomib SQ  1.3 mg/m2  Subcutaneous Once  . calcium acetate  667 mg Oral TID WC  . cyanocobalamin  1,000 mcg Intramuscular Daily  . [START ON 10/20/2016] dexamethasone  20 mg Intravenous Daily  . furosemide  160 mg Intravenous Q6H  . mouth rinse  15 mL Mouth Rinse BID  . [START ON 10/15/2016] metolazone  10 mg Oral Daily  . pantoprazole  40 mg Oral Daily  . sodium chloride flush  10-40 mL Intracatheter Q12H   Continuous Infusions: . DOPamine 2.5 mcg/kg/min (10/13/16 0143)     LOS: 8 days    Casy Tavano Tanna Furry, MD [facility] Pager [phone]  If 7PM-7AM, please contact night-coverage [email or web address] Password [facility] 10/14/2016, 11:56 AM

## 2016-10-14 NOTE — Progress Notes (Signed)
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 acyclovir  200 mg Oral Daily  . calcium acetate  667 mg Oral TID WC  . cyanocobalamin  1,000 mcg Intramuscular Daily  . dexamethasone  20 mg Intravenous Once  . furosemide  160 mg Intravenous Q6H  . mouth rinse  15 mL Mouth Rinse BID  . metolazone  10 mg Oral BID  . pantoprazole  40 mg Oral Daily  . sodium chloride flush  10-40 mL Intracatheter Q12H   Continuous Infusions: . DOPamine 2.5 mcg/kg/min (10/13/16 0143)   PRN Meds:.acetaminophen **OR** acetaminophen, calcium carbonate (dosed in mg elemental calcium), camphor-menthol **AND** hydrOXYzine, docusate sodium, docusate sodium, feeding supplement (NEPRO CARB STEADY), ipratropium-albuterol, ondansetron **OR** ondansetron (ZOFRAN) IV, oxyCODONE, sodium chloride flush, sorbitol, traZODone, zolpidem  Current Labs: reviewed    Physical Exam:  Blood pressure 118/79, pulse 88, temperature 98.4 F (36.9 C), resp. rate 18, height 5\' 10"  (1.778 m), weight 127.1 kg (280 lb 3.2 oz), SpO2 93 %. Obese, NAD Massive edema/anasarca Chronic venous stasis changes RRR CTAB Protuberant, limited abd exam AAO x3 Nonfocal  A 1. AKI and unclear chronicity; renal US normal sized kidneys w/o obstruction 2/10 2. Myeloma, recent start CTX Dr. Roni Bread 1. K:L ration 791, absolute Kappa 2689 2. SPEP pending 3. Oncology following inpatient 3. 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 Morbid obesity 5. HTN including low dose ACEi at home; held 6. Hypervolemia 7. Hypervolemic Hyponatremia; stable in mid 120s 8. Pancytopenia related to #2  P 1. Cont diuretics, inotrope; tolerating well 2. Cont medical management, no RRT at this point 3. Cont Na and fluid restriction   Pearson Grippe MD 10/14/2016, 8:25 AM   Recent Labs Lab 10/12/16 0500 10/13/16 0456 10/14/16 0530  NA 128* 126* 124*  K 4.4 3.9 4.4  CL 86* 85* 78*  CO2 28 28 29   GLUCOSE 111* 123* 142*  BUN 57* 57* 67*  CREATININE 3.36* 3.32* 3.39*  CALCIUM 8.2* 8.4* 8.9  PHOS 5.5* 5.2* 5.6*    Recent Labs Lab 10/07/16 1300 10/08/16 0147  10/11/16 0510 10/13/16 0456 10/14/16 0530  WBC 4.0 4.3  < > 3.6* 3.1* 5.2  NEUTROABS 2.9 3.1  --   --   --   --   HGB 7.5* 7.6*  < > 8.6* 8.1* 8.7*  HCT 23.4* 23.7*  < > 25.9* 24.1* 25.8*  MCV 95.1 93.7  < > 90.9 90.3 89.6  PLT 71* 82*  < > 74* 66* 66*  < > = values in this interval not displayed.

## 2016-10-15 ENCOUNTER — Inpatient Hospital Stay (HOSPITAL_COMMUNITY): Payer: BLUE CROSS/BLUE SHIELD

## 2016-10-15 DIAGNOSIS — E871 Hypo-osmolality and hyponatremia: Secondary | ICD-10-CM

## 2016-10-15 LAB — CBC
HCT: 24.1 % — ABNORMAL LOW (ref 39.0–52.0)
Hemoglobin: 8.1 g/dL — ABNORMAL LOW (ref 13.0–17.0)
MCH: 30.5 pg (ref 26.0–34.0)
MCHC: 33.6 g/dL (ref 30.0–36.0)
MCV: 90.6 fL (ref 78.0–100.0)
PLATELETS: 52 10*3/uL — AB (ref 150–400)
RBC: 2.66 MIL/uL — AB (ref 4.22–5.81)
RDW: 19.3 % — ABNORMAL HIGH (ref 11.5–15.5)
WBC: 4.2 10*3/uL (ref 4.0–10.5)

## 2016-10-15 LAB — RENAL FUNCTION PANEL
Albumin: 1.7 g/dL — ABNORMAL LOW (ref 3.5–5.0)
Anion gap: 18 — ABNORMAL HIGH (ref 5–15)
BUN: 80 mg/dL — AB (ref 6–20)
CO2: 30 mmol/L (ref 22–32)
CREATININE: 3.59 mg/dL — AB (ref 0.61–1.24)
Calcium: 8.6 mg/dL — ABNORMAL LOW (ref 8.9–10.3)
Chloride: 75 mmol/L — ABNORMAL LOW (ref 101–111)
GFR calc non Af Amer: 17 mL/min — ABNORMAL LOW (ref >60)
GFR, EST AFRICAN AMERICAN: 19 mL/min — AB (ref >60)
GLUCOSE: 133 mg/dL — AB (ref 65–99)
Phosphorus: 6.4 mg/dL — ABNORMAL HIGH (ref 2.5–4.6)
Potassium: 4.4 mmol/L (ref 3.5–5.1)
Sodium: 123 mmol/L — ABNORMAL LOW (ref 135–145)

## 2016-10-15 LAB — BASIC METABOLIC PANEL
Anion gap: 18 — ABNORMAL HIGH (ref 5–15)
BUN: 85 mg/dL — AB (ref 6–20)
CO2: 29 mmol/L (ref 22–32)
Calcium: 8.3 mg/dL — ABNORMAL LOW (ref 8.9–10.3)
Chloride: 78 mmol/L — ABNORMAL LOW (ref 101–111)
Creatinine, Ser: 3.5 mg/dL — ABNORMAL HIGH (ref 0.61–1.24)
GFR calc Af Amer: 20 mL/min — ABNORMAL LOW (ref >60)
GFR, EST NON AFRICAN AMERICAN: 17 mL/min — AB (ref >60)
GLUCOSE: 119 mg/dL — AB (ref 65–99)
POTASSIUM: 3.8 mmol/L (ref 3.5–5.1)
Sodium: 125 mmol/L — ABNORMAL LOW (ref 135–145)

## 2016-10-15 MED ORDER — CALCIUM CARBONATE ANTACID 500 MG PO CHEW
1.0000 | CHEWABLE_TABLET | Freq: Four times a day (QID) | ORAL | Status: DC | PRN
  Administered 2016-10-15: 200 mg via ORAL
  Filled 2016-10-15: qty 1

## 2016-10-15 NOTE — Progress Notes (Signed)
PROGRESS NOTE    Lance [name]  VPX:[id number] DOB: 1956-04-19 DOA: 10/06/2016 PCP: No primary care provider on file.   Brief Narrative: 61 y.o.gentleman with a history of transfusion dependent multiple myeloma, systolic heart failure with EF 35-40% in January 2018, pancytopenia, and HTN who was admitted to [facility] on 10/04/2016 for management of CHF exacerbation. The patient has required several transfusions in the past 30 days. He presented with shortness of breath and anasarca. Transferred from [facility] for nephrology consultation. At [place], BNP was 13000. He had a CT of the abdomen and pelvis without contrast that was negative for hydronephrosis and kidney stones.. Moderate ascites noted. Chest xray showed cardiomegaly, mild CHF, and small bilateral effusions. Lower extremity dopplers were negative for DVT.  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 Echocardiogram with EF of 35% with diffuse hypokinesis. -Patient is currently on IV Lasix 160 mg every 6 hourly/ metolazone with inotropic support. I discontinued metolazone today because of worsening hyponatremia. -Patient has significant urine output with net negative by almost 22 L since admission. Patient lost more than 40 pounds of weight since admission. -Monitor electrolytes, renal function -Cardiology consult appreciated.  #Acute kidney injury on chronic kidney disease likely stage III: Likely in the setting of multiple myeloma and congestive heart failure. -Serum creatinine level mildly trending up today. Discontinued metolazone and continue IV Lasix for today. Continue to monitor serum electrolytes, creatinine and urine output.  -Patient is currently on IV diuretics with inotropic support -Nephrology consult appreciated. Continue to monitor renal function. Avoid  nephrotoxins. -Discussed with the nephrologist at bedside.  #Acute hypoxic respiratory failure in the setting of CHF: - Continue to monitor. Requiring about 2 L of oxygen.  #History of multiple myeloma: Patient was followed by oncologist outpatient and was treated recently. Patient has Lambda ratio of 791. -Started chemotherapy including Velcade, steroid, acyclovir from 2/16. -Monitor hemoglobin, received 2 units of red blood cell transfusion on 2/13 -Oncology consult appreciated.  #Hyponatremia in the setting of congestive heart failure and diuretics (metolazone). Serum sodium level 123.  -Urine electrolytes and osmolality reviewed. Discontinue metolazone. If continues to worsen serum sodium level may need to cut back on IV diuretics.  # Goals of care discussion: Palliative care consult appreciated. Patient is DO NOT RESUSCITATE. Patient understand the severity of illness.   #Left-sided back pain: I will obtain thoracic and lumbar x-ray.  DVT prophylaxis: SCD. No anticoagulation because of anemia and thrombocytopenia. Code Status: Full code Family Communication: No family present at bedside. Disposition Plan: Likely discharge home in 3-4 days.    Consultants:   Cardiologist  Nephrologist  Palliative care  Oncologist  Procedures:  Chest x-ray 10/06/2016  2-D echo  10/07/2016  Renal ultrasound 10/07/2016  PICC line 10/09/2016 Antimicrobials: None Subjective: Patient was seen and examined at bedside. Patient reported left-sided back pain. Denied nausea vomiting and headache chest pain or shortness of breath.  Objective: Vitals:   10/14/16 1513 10/14/16 1954 10/15/16 0051 10/15/16 0504  BP: 138/70 132/68 124/63 112/69  Pulse: 93 91 95 89  Resp: 20 (!) 22 18 20   Temp: 98 F (36.7 C) 98.3 F (36.8 C) 98.2 F (36.8 C) 97.7 F (36.5 C)  TempSrc: Oral Axillary    SpO2: 95% 93% 94% 96%  Weight:    124 kg (273 lb 4.8 oz)  Height:        Intake/Output  Summary  (Last 24 hours) at 10/15/16 1139 Last data filed at 10/15/16 1058  Gross per 24 hour  Intake          1503.24 ml  Output             3600 ml  Net         -2096.76 ml   Filed Weights   10/13/16 0345 10/14/16 0455 10/15/16 0504  Weight: 132 kg (290 lb 14.4 oz) 127.1 kg (280 lb 3.2 oz) 124 kg (273 lb 4.8 oz)    Examination:  General exam: Sitting on chair comfortable, not in distress Respiratory system: Decreased breath sound bibasal, respiratory effort normal Cardiovascular system: Regular rate rhythm, S1-S2 normal.   Gastrointestinal system: Abdomen is soft, nontender. Distended. Normal bowel sound Central nervous system: Alert and oriented. No focal neurological deficits. Extremities: Bilateral lower extremities was wrapped, unchanged Skin: No rashes, lesions or ulcers Psychiatry: Judgement and insight appear normal. Mood & affect appropriate.     Data Reviewed: I have personally reviewed following labs and imaging studies  CBC:  Recent Labs Lab 10/10/16 0450 10/10/16 2200 10/11/16 0510 10/13/16 0456 10/14/16 0530 10/15/16 0454  WBC 3.8*  --  3.6* 3.1* 5.2 4.2  HGB 7.0* 8.3* 8.6* 8.1* 8.7* 8.1*  HCT 21.6* 25.0* 25.9* 24.1* 25.8* 24.1*  MCV 92.3  --  90.9 90.3 89.6 90.6  PLT 80*  --  74* 66* 66* 52*   Basic Metabolic Panel:  Recent Labs Lab 10/10/16 0450 10/11/16 0510 10/12/16 0500 10/13/16 0456 10/14/16 0530 10/15/16 0454  NA 125* 126* 128* 126* 124* 123*  K 4.4 4.4 4.4 3.9 4.4 4.4  CL 89* 85* 86* 85* 78* 75*  CO2 23 25 28 28 29 30   GLUCOSE 112* 95 111* 123* 142* 133*  BUN 50* 54* 57* 57* 67* 80*  CREATININE 3.64* 3.64* 3.36* 3.32* 3.39* 3.59*  CALCIUM 7.3* 7.9* 8.2* 8.4* 8.9 8.6*  MG 2.1  --   --   --   --   --   PHOS 6.3* 5.6* 5.5* 5.2* 5.6* 6.4*   GFR: Estimated Creatinine Clearance: 28.2 mL/min (by C-G formula based on SCr of 3.59 mg/dL (H)). Liver Function Tests:  Recent Labs Lab 10/11/16 0510 10/12/16 0500 10/13/16 0456 10/14/16 0530  10/15/16 0454  ALBUMIN 1.6* 1.6* 1.4* 1.5* 1.7*   No results for input(s): LIPASE, AMYLASE in the last 168 hours. No results for input(s): AMMONIA in the last 168 hours. Coagulation Profile:  Recent Labs Lab 10/11/16 1505  INR 1.96   Cardiac Enzymes: No results for input(s): CKTOTAL, CKMB, CKMBINDEX, TROPONINI in the last 168 hours. BNP (last 3 results) No results for input(s): PROBNP in the last 8760 hours. HbA1C: No results for input(s): HGBA1C in the last 72 hours. CBG:  Recent Labs Lab 10/09/16 0827  GLUCAP 94   Lipid Profile: No results for input(s): CHOL, HDL, LDLCALC, TRIG, CHOLHDL, LDLDIRECT in the last 72 hours. Thyroid Function Tests: No results for input(s): TSH, T4TOTAL, FREET4, T3FREE, THYROIDAB in the last 72 hours. Anemia Panel: No results for input(s): VITAMINB12, FOLATE, FERRITIN, TIBC, IRON, RETICCTPCT in the last 72 hours. Sepsis Labs: No results for input(s): PROCALCITON, LATICACIDVEN in the last 168 hours.  Recent Results (from the past 240 hour(s))  Urine culture     Status: None   Collection Time: 10/07/16 10:10 PM  Result Value Ref Range Status   Specimen Description URINE, RANDOM  Final   Special Requests ADDED 2671 10/08/16  Final  Culture NO GROWTH  Final   Report Status 10/09/2016 FINAL  Final  MRSA PCR Screening     Status: None   Collection Time: 10/09/16  1:26 PM  Result Value Ref Range Status   MRSA by PCR NEGATIVE NEGATIVE Final    Comment:        The GeneXpert MRSA Assay (FDA approved for NASAL specimens only), is one component of a comprehensive MRSA colonization surveillance program. It is not intended to diagnose MRSA infection nor to guide or monitor treatment for MRSA infections.          Radiology Studies: No results found.      Scheduled Meds: . acyclovir  200 mg Oral Daily  . [START ON 10/17/2016] bortezomib SQ  1.3 mg/m2 Subcutaneous Once  . calcium acetate  667 mg Oral TID WC  . cyanocobalamin  1,000  mcg Intramuscular Daily  . [START ON 10/20/2016] dexamethasone  20 mg Intravenous Daily  . furosemide  160 mg Intravenous Q6H  . mouth rinse  15 mL Mouth Rinse BID  . pantoprazole  40 mg Oral Daily  . sodium chloride flush  10-40 mL Intracatheter Q12H   Continuous Infusions: . DOPamine 2.5 mcg/kg/min (10/14/16 1726)     LOS: 9 days    Carmyn Hamm Tanna Furry, MD [facility] Pager [phone]  If 7PM-7AM, please contact night-coverage [email or web address] Password [facility] 10/15/2016, 11:39 AM

## 2016-10-15 NOTE — Progress Notes (Signed)
Subjective:   61 y/o male with apparent longstanding multiple myeloma (per Dr. Wendy Poet note in [place]) now with severe biventricular HF,multiple myeloma,  renal failure and massive volume overload (R> L HF) not responding to high-dose diuretics.   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   Sitting in chair doing well    Objective:    Vital Signs:   Temp:  [97.7 F (36.5 C)-98.3 F (36.8 C)] 97.7 F (36.5 C) (02/18 0504) Pulse Rate:  [89-95] 89 (02/18 0504) Resp:  [18-22] 20 (02/18 0504) BP: (112-138)/(63-70) 112/69 (02/18 0504) SpO2:  [93 %-96 %] 96 % (02/18 0504) Weight:  [273 lb 4.8 oz (124 kg)] 273 lb 4.8 oz (124 kg) (02/18 0504) Last BM Date: 10/09/16  Weight change: Filed Weights   10/13/16 0345 10/14/16 0455 10/15/16 0504  Weight: 290 lb 14.4 oz (132 kg) 280 lb 3.2 oz (127.1 kg) 273 lb 4.8 oz (124 kg)    Intake/Output:   Intake/Output Summary (Last 24 hours) at 10/15/16 0836 Last data filed at 10/15/16 0600  Gross per 24 hour  Intake          1793.24 ml  Output             4025 ml  Net         -2231.76 ml     Physical Exam: Coox 83%  General: Sitting in the chair. Resting HEENT: normal R periorbital eccyhmosis Neck: supple. Thick neck, JVP to ear . Carotids 2+ bilat; no bruits. No lymphadenopathy or thryomegaly appreciated. Cor: PMI nondisplaced. Regular rate & rhythm. No rubs, gallops 2/6 TR Lungs: Decreased throughout , on room air  Abdomen: obese, soft, nontender, distended. No hepatosplenomegaly. No bruits or masses. Good bowel sounds. Extremities: no cyanosis, clubbing, rash, R and LLE 2-3+ edema R and LLE erythema noted anterior aspect of both LEs  Neuro: alert & orientedx3, cranial nerves grossly intact. moves all 4 extremities w/o difficulty. Affect pleasant  Telemetry: NSR 90s 10/15/2016   Labs: Basic Metabolic  Panel:  Recent Labs Lab 10/10/16 0450 10/11/16 0510 10/12/16 0500 10/13/16 0456 10/14/16 0530 10/15/16 0454  NA 125* 126* 128* 126* 124* 123*  K 4.4 4.4 4.4 3.9 4.4 4.4  CL 89* 85* 86* 85* 78* 75*  CO2 23 25 28 28 29 30   GLUCOSE 112* 95 111* 123* 142* 133*  BUN 50* 54* 57* 57* 67* 80*  CREATININE 3.64* 3.64* 3.36* 3.32* 3.39* 3.59*  CALCIUM 7.3* 7.9* 8.2* 8.4* 8.9 8.6*  MG 2.1  --   --   --   --   --   PHOS 6.3* 5.6* 5.5* 5.2* 5.6* 6.4*    Liver Function Tests:  Recent Labs Lab 10/11/16 0510 10/12/16 0500 10/13/16 0456 10/14/16 0530 10/15/16 0454  ALBUMIN 1.6* 1.6* 1.4* 1.5* 1.7*    CBC:  Recent Labs Lab 10/10/16 0450 10/10/16 2200 10/11/16 0510 10/13/16 0456 10/14/16 0530 10/15/16 0454  WBC 3.8*  --  3.6* 3.1* 5.2 4.2  HGB 7.0* 8.3* 8.6* 8.1* 8.7* 8.1*  HCT 21.6* 25.0* 25.9* 24.1* 25.8* 24.1*  MCV 92.3  --  90.9 90.3 89.6 90.6  PLT 80*  --  74* 66* 66* 52*     BNP: BNP (last 3 results)  Recent Labs  10/06/16 2101  BNP 1,535.9*    Medications:     Scheduled Medications: .  acyclovir  200 mg Oral Daily  . [START ON 10/17/2016] bortezomib SQ  1.3 mg/m2 Subcutaneous Once  . calcium acetate  667 mg Oral TID WC  . cyanocobalamin  1,000 mcg Intramuscular Daily  . [START ON 10/20/2016] dexamethasone  20 mg Intravenous Daily  . furosemide  160 mg Intravenous Q6H  . mouth rinse  15 mL Mouth Rinse BID  . pantoprazole  40 mg Oral Daily  . sodium chloride flush  10-40 mL Intracatheter Q12H    Infusions: . DOPamine 2.5 mcg/kg/min (10/14/16 1726)    PRN Medications: acetaminophen **OR** acetaminophen, calcium carbonate (dosed in mg elemental calcium), camphor-menthol **AND** hydrOXYzine, docusate sodium, docusate sodium, feeding supplement (NEPRO CARB STEADY), ipratropium-albuterol, ondansetron **OR** ondansetron (ZOFRAN) IV, oxyCODONE, sodium chloride flush, sorbitol, traZODone, zolpidem   Assessment:   1. Acute Biventricular Heart Failure 2.  Acute on chronic Renal Failure 3. Anemia 4. Multiple Myeloma 5. Hyponatremia   Plan/Discussion:    61 y/o male with apparent longstanding multiple myeloma (per Dr. 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  He continues to have good diuresis and coox while on dopamine and iv diuretics Will continue today Advanced CHF team to see in am and possibly start to wean dopamine  Jenkins Rouge

## 2016-10-15 NOTE — Progress Notes (Signed)
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 acyclovir  200 mg Oral Daily  . [START ON 10/17/2016] bortezomib SQ  1.3 mg/m2 Subcutaneous Once  . calcium acetate  667 mg Oral TID WC  . cyanocobalamin  1,000 mcg Intramuscular Daily  . [START ON 10/20/2016] dexamethasone  20 mg Intravenous Daily  . furosemide  160 mg Intravenous Q6H  . mouth rinse  15 mL Mouth Rinse BID  . pantoprazole  40 mg Oral Daily  . sodium chloride flush  10-40 mL Intracatheter Q12H   Continuous Infusions: . DOPamine 2.5 mcg/kg/min (10/14/16 1726)   PRN Meds:.acetaminophen **OR** acetaminophen, calcium carbonate (dosed in mg elemental calcium), camphor-menthol **AND** hydrOXYzine, docusate sodium, docusate sodium, feeding supplement (NEPRO CARB STEADY), ipratropium-albuterol, ondansetron **OR** ondansetron (ZOFRAN) IV, oxyCODONE, sodium chloride flush, sorbitol, traZODone, zolpidem  Current Labs: reviewed    Physical Exam:  Blood pressure 112/69, pulse 89, temperature 97.7 F (36.5 C), resp. rate 20, height 5\' 10"  (1.778 m), weight 124 kg (273 lb 4.8 oz), SpO2 96 %. Obese, NAD Massive edema/anasarca Chronic venous stasis changes RRR CTAB Protuberant, limited abd exam AAO x3 Nonfocal  A 1. AKI and unclear chronicity; renal US normal sized kidneys w/o obstruction 2/10 2. Myeloma, recent start CTX Dr. Roni Bread 1. K:L ration 791, absolute Kappa 2689 2. SPEP  pending 3. Oncology following inpatient, rec as inpatient Velcade and Dexamethasone 3. 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 Morbid obesity 5. HTN including low dose ACEi at home; held 6. Hypervolemia 7. Hypervolemic Hyponatremia; stable in mid 120s likely diuretic and CHF related 8. Pancytopenia related to #2  P 1. Cont lasix, inotrope; tolerating well  2. Cont medical management, no RRT at this point 3. Cont Na and fluid restriction -- discussed with him drinking less water/liquids today 4. Watch SCr might need to move over to oral diuretics in near future 5. BMP This PM   Pearson Grippe MD 10/15/2016, 8:18 AM   Recent Labs Lab 10/13/16 0456 10/14/16 0530 10/15/16 0454  NA 126* 124* 123*  K 3.9 4.4 4.4  CL 85* 78* 75*  CO2 28 29 30   GLUCOSE 123* 142* 133*  BUN 57* 67* 80*  CREATININE 3.32* 3.39* 3.59*  CALCIUM 8.4* 8.9 8.6*  PHOS 5.2* 5.6* 6.4*    Recent Labs Lab 10/13/16 0456 10/14/16 0530 10/15/16 0454  WBC 3.1* 5.2 4.2  HGB 8.1* 8.7* 8.1*  HCT 24.1* 25.8* 24.1*  MCV 90.3 89.6 90.6  PLT 66* 66* 52*

## 2016-10-16 LAB — CBC
HCT: 23.1 % — ABNORMAL LOW (ref 39.0–52.0)
Hemoglobin: 7.6 g/dL — ABNORMAL LOW (ref 13.0–17.0)
MCH: 29.9 pg (ref 26.0–34.0)
MCHC: 32.9 g/dL (ref 30.0–36.0)
MCV: 90.9 fL (ref 78.0–100.0)
Platelets: 40 K/uL — ABNORMAL LOW (ref 150–400)
RBC: 2.54 MIL/uL — ABNORMAL LOW (ref 4.22–5.81)
RDW: 19.3 % — ABNORMAL HIGH (ref 11.5–15.5)
WBC: 2.6 K/uL — ABNORMAL LOW (ref 4.0–10.5)

## 2016-10-16 LAB — RENAL FUNCTION PANEL
ANION GAP: 17 — AB (ref 5–15)
Albumin: 1.7 g/dL — ABNORMAL LOW (ref 3.5–5.0)
BUN: 97 mg/dL — ABNORMAL HIGH (ref 6–20)
CALCIUM: 8.3 mg/dL — AB (ref 8.9–10.3)
CHLORIDE: 75 mmol/L — AB (ref 101–111)
CO2: 32 mmol/L (ref 22–32)
Creatinine, Ser: 3.6 mg/dL — ABNORMAL HIGH (ref 0.61–1.24)
GFR calc non Af Amer: 17 mL/min — ABNORMAL LOW (ref >60)
GFR, EST AFRICAN AMERICAN: 19 mL/min — AB (ref >60)
Glucose, Bld: 92 mg/dL (ref 65–99)
POTASSIUM: 4.2 mmol/L (ref 3.5–5.1)
Phosphorus: 5.8 mg/dL — ABNORMAL HIGH (ref 2.5–4.6)
Sodium: 124 mmol/L — ABNORMAL LOW (ref 135–145)

## 2016-10-16 LAB — GLUCOSE, CAPILLARY: Glucose-Capillary: 105 mg/dL — ABNORMAL HIGH (ref 65–99)

## 2016-10-16 LAB — COOXEMETRY PANEL
Carboxyhemoglobin: 1.6 % — ABNORMAL HIGH (ref 0.5–1.5)
Methemoglobin: 1.2 % (ref 0.0–1.5)
O2 SAT: 84.5 %
Total hemoglobin: 7.8 g/dL — ABNORMAL LOW (ref 12.0–16.0)

## 2016-10-16 MED ORDER — POLYETHYLENE GLYCOL 3350 17 G PO PACK
17.0000 g | PACK | Freq: Every day | ORAL | Status: DC
  Administered 2016-10-16 – 2016-10-20 (×5): 17 g via ORAL
  Filled 2016-10-16 (×5): qty 1

## 2016-10-16 MED ORDER — DOCUSATE SODIUM 100 MG PO CAPS
100.0000 mg | ORAL_CAPSULE | Freq: Two times a day (BID) | ORAL | Status: DC
  Administered 2016-10-16 – 2016-10-20 (×8): 100 mg via ORAL
  Filled 2016-10-16 (×8): qty 1

## 2016-10-16 MED ORDER — BISACODYL 10 MG RE SUPP
10.0000 mg | Freq: Once | RECTAL | Status: AC
  Administered 2016-10-16: 10 mg via RECTAL
  Filled 2016-10-16: qty 1

## 2016-10-16 MED ORDER — SALINE SPRAY 0.65 % NA SOLN
1.0000 | NASAL | Status: DC | PRN
  Administered 2016-10-16: 1 via NASAL
  Filled 2016-10-16: qty 44

## 2016-10-16 NOTE — Progress Notes (Signed)
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 acyclovir  200 mg Oral Daily  . [START ON 10/17/2016] bortezomib SQ  1.3 mg/m2 Subcutaneous Once  . calcium acetate  667 mg Oral TID WC  . cyanocobalamin  1,000 mcg Intramuscular Daily  . [START ON 10/20/2016] dexamethasone  20 mg Intravenous Daily  . furosemide  160 mg Intravenous Q6H  . mouth rinse  15 mL Mouth Rinse BID  . pantoprazole  40 mg Oral Daily  . sodium chloride flush  10-40 mL Intracatheter Q12H   Continuous Infusions: . DOPamine 2.5 mcg/kg/min (10/14/16 1726)   PRN Meds:.acetaminophen **OR** acetaminophen, calcium carbonate, camphor-menthol **AND** hydrOXYzine, docusate sodium, docusate sodium, feeding supplement (NEPRO CARB STEADY), ipratropium-albuterol, ondansetron **OR** ondansetron (ZOFRAN) IV, oxyCODONE, sodium chloride flush, sorbitol, traZODone, zolpidem  Current Labs: reviewed    Physical Exam:  Blood pressure (!) 109/56, pulse 83, temperature 98.3 F (36.8 C), temperature source Oral, resp. rate 14, height 5\' 10"  (1.778 m), weight 124.1 kg (273 lb 8 oz), SpO2 90 %. Obese, NAD Massive edema/anasarca Chronic venous stasis changes RRR CTAB Protuberant, limited abd exam AAO x3 Nonfocal  A 1. AKI and unclear chronicity; renal US normal sized kidneys w/o obstruction 2/10 2. Myeloma, recent start CTX Dr. Roni Bread 1. K:L ration 791, absolute Kappa 2689 2. SPEP large M spike 2/12 3. Oncology following inpatient, rec as inpatient Velcade and  Dexamethasone 3. 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 Morbid obesity 5. HTN including low dose ACEi at home; held 6. Hypervolemia 7. Hypervolemic Hyponatremia; stable in mid 120s likely diuretic and CHF related 8. Pancytopenia related to #2  P 1. I think coming close to time to ween dopamine and move to po diuretics, will let AHF eval patient this AM 2. Cont medical management, no RRT at this point 3. Cont Na and fluid restriction -- discussed with him drinking less water/liquids today   Pearson Grippe MD 10/16/2016, 8:06 AM   Recent Labs Lab 10/14/16 0530 10/15/16 0454 10/15/16 1330 10/16/16 0542  NA 124* 123* 125* 124*  K 4.4 4.4 3.8 4.2  CL 78* 75* 78* 75*  CO2 29 30 29  32  GLUCOSE 142* 133* 119* 92  BUN 67* 80* 85* 97*  CREATININE 3.39* 3.59* 3.50* 3.60*  CALCIUM 8.9 8.6* 8.3* 8.3*  PHOS 5.6* 6.4*  --  5.8*    Recent Labs Lab 10/14/16 0530 10/15/16 0454 10/16/16 0542  WBC 5.2 4.2 2.6*  HGB 8.7* 8.1* 7.6*  HCT 25.8* 24.1* 23.1*  MCV 89.6 90.6 90.9  PLT 66* 52* 40*

## 2016-10-16 NOTE — Progress Notes (Signed)
PROGRESS NOTE    Lance [name]  TJQ:[id number] DOB: 1956/06/06 DOA: 10/06/2016 PCP: No primary care provider on file.   Brief Narrative: 61 y.o.gentleman with a history of transfusion dependent multiple myeloma, systolic heart failure with EF 35-40% in January 2018, pancytopenia, and HTN who was admitted to [facility] on 10/04/2016 for management of CHF exacerbation. The patient has required several transfusions in the past 30 days. He presented with shortness of breath and anasarca. Transferred from [facility] for nephrology consultation. At [place], BNP was 13000. He had a CT of the abdomen and pelvis without contrast that was negative for hydronephrosis and kidney stones.. Moderate ascites noted. Chest xray showed cardiomegaly, mild CHF, and small bilateral effusions. Lower extremity dopplers were negative for DVT.  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 Echocardiogram with EF of 35% with diffuse hypokinesis. -Treated with IV diuretics with net negative of 23 L since admission. Diuretics discontinued today by heart failure team because of uptrending creatinine level. Currently on IV dopamine for inotropic support. May need to resume oral Lasix when kidney function is stabilized. Cardiology team consult appreciated. Discussed with them today.  #Acute kidney injury on chronic kidney disease likely stage III: Likely in the setting of multiple myeloma and congestive heart failure. -Serum creatinine level mildly trending up to 3.6. Off diuretics. Continue to monitor BMP and urine output.  -Nephrology consult appreciated. Continue to monitor renal function. Avoid nephrotoxins.  #Acute hypoxic respiratory failure in the setting of CHF: - Continue to monitor. Requiring about 2 L of oxygen.  #History of multiple myeloma: Patient was followed  by oncologist outpatient and was treated recently. Patient has Lambda ratio of 791. -Started chemotherapy including Velcade, steroid, acyclovir from 2/16. -Monitor hemoglobin, received 2 units of red blood cell transfusion on 2/13.  -Oncology consult appreciated.  #Pancytopenia likely in the setting of chemotherapy. Continue to monitor CBC. Follow-up oncologist. Patient has no sign of bleeding.  #Hyponatremia in the setting of congestive heart failure and diuretics (metolazone). Serum sodium level 124.  -Urine electrolytes and osmolality reviewed. Holding diuretics today. Monitor BMP.  # Goals of care discussion: Palliative care consult appreciated. Patient is DO NOT RESUSCITATE. Patient understand the severity of illness.   #Left-sided back pain: Back x-ray unremarkable. Patient reported constipation. -Ordered MiraLAX, Colace and Dulcolax enema.  DVT prophylaxis: SCD. No anticoagulation because of pancytopenia. Code Status: Full code Family Communication: Patient's sister at bedside Disposition Plan: Likely discharge home in 3-4 days.    Consultants:   Cardiologist  Nephrologist  Palliative care  Oncologist  Procedures:  Chest x-ray 10/06/2016  2-D echo  10/07/2016  Renal ultrasound 10/07/2016  PICC line 10/09/2016 Antimicrobials: None Subjective: Patient was seen and examined at bedside. Patient reported weak and lightheadedness this morning. Denies chest pain, shortness of breath, nausea or vomiting. Sister at bedside.  Objective: Vitals:   10/15/16 2357 10/16/16 0049 10/16/16 0416 10/16/16 0756  BP: (!) 111/58 (!) 116/57 118/65 (!) 109/56  Pulse: 83 88 85 83  Resp: 11 16 16 14   Temp: 97.8 F (36.6 C)  98.4 F (36.9 C) 98.3 F (36.8 C)  TempSrc:    Oral  SpO2: 96% 90% 94% 90%  Weight:   124.1 kg (273 lb 8 oz)   Height:        Intake/Output Summary (Last 24 hours) at 10/16/16 1001 Last data filed at 10/16/16 7622  Gross per 24 hour  Intake            1294.8 ml  Output             2575 ml  Net          -1280.2 ml   Filed Weights   10/14/16 0455 10/15/16 0504 10/16/16 0416  Weight: 127.1 kg (280 lb 3.2 oz) 124 kg (273 lb 4.8 oz) 124.1 kg (273 lb 8 oz)    Examination:  General exam: Sitting on chair comfortably, not in distress. Respiratory system: Clear bilateral, respiratory effort normal, no wheezing Cardiovascular system: Regular rate rhythm, S1-S2 normal Gastrointestinal system: Abdomen soft, bowel sound positive, distention unchanged, nontender. Central nervous system: Alert and oriented. No focal neurological deficits. Extremities: Bilateral lower extremities was wrapped, unchanged Skin: No rashes, lesions or ulcers Psychiatry: Judgement and insight appear normal. Mood & affect appropriate.     Data Reviewed: I have personally reviewed following labs and imaging studies  CBC:  Recent Labs Lab 10/11/16 0510 10/13/16 0456 10/14/16 0530 10/15/16 0454 10/16/16 0542  WBC 3.6* 3.1* 5.2 4.2 2.6*  HGB 8.6* 8.1* 8.7* 8.1* 7.6*  HCT 25.9* 24.1* 25.8* 24.1* 23.1*  MCV 90.9 90.3 89.6 90.6 90.9  PLT 74* 66* 66* 52* 40*   Basic Metabolic Panel:  Recent Labs Lab 10/10/16 0450  10/12/16 0500 10/13/16 0456 10/14/16 0530 10/15/16 0454 10/15/16 1330 10/16/16 0542  NA 125*  < > 128* 126* 124* 123* 125* 124*  K 4.4  < > 4.4 3.9 4.4 4.4 3.8 4.2  CL 89*  < > 86* 85* 78* 75* 78* 75*  CO2 23  < > 28 28 29 30 29  32  GLUCOSE 112*  < > 111* 123* 142* 133* 119* 92  BUN 50*  < > 57* 57* 67* 80* 85* 97*  CREATININE 3.64*  < > 3.36* 3.32* 3.39* 3.59* 3.50* 3.60*  CALCIUM 7.3*  < > 8.2* 8.4* 8.9 8.6* 8.3* 8.3*  MG 2.1  --   --   --   --   --   --   --   PHOS 6.3*  < > 5.5* 5.2* 5.6* 6.4*  --  5.8*  < > = values in this interval not displayed. GFR: Estimated Creatinine Clearance: 28.1 mL/min (by C-G formula based on SCr of 3.6 mg/dL (H)). Liver Function Tests:  Recent Labs Lab 10/12/16 0500 10/13/16 0456 10/14/16 0530  10/15/16 0454 10/16/16 0542  ALBUMIN 1.6* 1.4* 1.5* 1.7* 1.7*   No results for input(s): LIPASE, AMYLASE in the last 168 hours. No results for input(s): AMMONIA in the last 168 hours. Coagulation Profile:  Recent Labs Lab 10/11/16 1505  INR 1.96   Cardiac Enzymes: No results for input(s): CKTOTAL, CKMB, CKMBINDEX, TROPONINI in the last 168 hours. BNP (last 3 results) No results for input(s): PROBNP in the last 8760 hours. HbA1C: No results for input(s): HGBA1C in the last 72 hours. CBG: No results for input(s): GLUCAP in the last 168 hours. Lipid Profile: No results for input(s): CHOL, HDL, LDLCALC, TRIG, CHOLHDL, LDLDIRECT in the last 72 hours. Thyroid Function Tests: No results for input(s): TSH, T4TOTAL, FREET4, T3FREE, THYROIDAB in the last 72 hours. Anemia Panel: No results for input(s): VITAMINB12, FOLATE, FERRITIN, TIBC, IRON, RETICCTPCT in the last 72 hours. Sepsis Labs: No results for input(s): PROCALCITON, LATICACIDVEN in the last 168 hours.  Recent Results (from the past 240 hour(s))  Urine culture     Status: None   Collection Time:  10/07/16 10:10 PM  Result Value Ref Range Status   Specimen Description URINE, RANDOM  Final   Special Requests ADDED 0523 10/08/16  Final   Culture NO GROWTH  Final   Report Status 10/09/2016 FINAL  Final  MRSA PCR Screening     Status: None   Collection Time: 10/09/16  1:26 PM  Result Value Ref Range Status   MRSA by PCR NEGATIVE NEGATIVE Final    Comment:        The GeneXpert MRSA Assay (FDA approved for NASAL specimens only), is one component of a comprehensive MRSA colonization surveillance program. It is not intended to diagnose MRSA infection nor to guide or monitor treatment for MRSA infections.          Radiology Studies: Dg Lumbar Spine 1 View  Result Date: 10/15/2016 CLINICAL DATA:  61 year old male with back pain and a history of transfusion dependent multiple myeloma. Admitted to [facility]  earlier this month, transferred for nephrology consultation. EXAM: LUMBAR SPINE - 1 VIEW COMPARISON:  [facility] CT Abdomen and Pelvis 10/05/2016. [facility] radiographic bone survey 07/15/2014. FINDINGS: Standing AP view of the lumbar spine. Normal lumbar segmentation. This single view is most easily compared to the 2015 bone survey AP view of the lumbar spine. Chronic mild L1 and L2 compression appears stable since that time. L3 through L5 vertebral body height appears stable. Visible lower thoracic levels appear grossly stable, see also thoracic spine series from today reported separately. IMPRESSION: Stable AP radiographic appearance of lumbar spine since 2015. Electronically Signed   By: Genevie Ann M.D.   On: 10/15/2016 14:09   Dg Thoracic Spine 1 View  Result Date: 10/15/2016 CLINICAL DATA:  61 year old male with back pain and a history of transfusion dependent multiple myeloma. Admitted to [facility] earlier this month, transferred for nephrology consultation. EXAM: THORACIC SPINE 1 VIEW COMPARISON:  [facility] portable chest radiograph 08/27/2016 and radiographic skeletal survey 07/15/2014. Standing AP view of the lumbar spine today reported separately. FINDINGS: Normal thoracic segmentation. AP thoracic vertebral height and alignment appears stable since 2015. Bone mineralization appears stable. Streaky left lung base opacity and small left pleural effusion are evident. Partially visible right PICC line or central line. IMPRESSION: 1. Stable AP radiographic appearance of the thoracic spine since 2015. 2. Partially visible left pleural effusion and streaky left lung base opacity. Electronically Signed   By: Genevie Ann M.D.   On: 10/15/2016 14:12        Scheduled Meds: . acyclovir  200 mg Oral Daily  . bisacodyl  10 mg Rectal Once  . [START ON 10/17/2016] bortezomib SQ  1.3 mg/m2 Subcutaneous Once  . calcium acetate  667 mg Oral TID WC  . cyanocobalamin  1,000 mcg  Intramuscular Daily  . [START ON 10/20/2016] dexamethasone  20 mg Intravenous Daily  . mouth rinse  15 mL Mouth Rinse BID  . pantoprazole  40 mg Oral Daily  . polyethylene glycol  17 g Oral Daily  . sodium chloride flush  10-40 mL Intracatheter Q12H   Continuous Infusions: . DOPamine 2.5 mcg/kg/min (10/14/16 1726)     LOS: 10 days    Dron Tanna Furry, MD [facility] Pager [phone]  If 7PM-7AM, please contact night-coverage [email or web address] Password [email or web address] 10/16/2016, 10:01 AM

## 2016-10-16 NOTE — Progress Notes (Signed)
Advanced Heart Failure Rounding Note   Subjective:   61 y/o male with apparent longstanding multiple myeloma (per Dr. 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   2/12 started dopamine and continued high dose diuretics.   Made DNR per pt request this admission.   Out 1.1 L . Out 43 lbs all together. Creatinine trending up.    Feeling more tired and slightly dizzy today. CVP 18-19  Has been seen by Palliative Care and Oncology. Velcade and dexamethasone ordered, hasn't started Velcade as he wants to check on cost.   Objective:    Vital Signs:   Temp:  [97.5 F (36.4 C)-98.4 F (36.9 C)] 98.3 F (36.8 C) (02/19 0756) Pulse Rate:  [83-88] 83 (02/19 0756) Resp:  [11-17] 14 (02/19 0756) BP: (109-118)/(56-65) 109/56 (02/19 0756) SpO2:  [90 %-99 %] 90 % (02/19 0756) Weight:  [273 lb 8 oz (124.1 kg)] 273 lb 8 oz (124.1 kg) (02/19 0416) Last BM Date: 10/09/16  Weight change: Filed Weights   10/14/16 0455 10/15/16 0504 10/16/16 0416  Weight: 280 lb 3.2 oz (127.1 kg) 273 lb 4.8 oz (124 kg) 273 lb 8 oz (124.1 kg)    Intake/Output:   Intake/Output Summary (Last 24 hours) at 10/16/16 0917 Last data filed at 10/16/16 0850  Gross per 24 hour  Intake           1294.8 ml  Output             2575 ml  Net          -1280.2 ml     Physical Exam: CVP 12-13 General: Seated in chair. Fatigued appearing.   HEENT: normal, R periorbital eccyhmosis Neck: supple. JVP 12-13 cm. Carotids 2+ bilat; no bruits. No thyromegaly or nodule noted.  Cor: PMI nondisplaced. RRR. No rubs, gallops 2/6 TR Lungs: Diminished throughout, on RA.   Abdomen: obese, soft, NT, ND, no HSM. No bruits or masses. +BS  Extremities: no cyanosis, clubbing, rash, R and LLE 2+ edema R and LLE erythema noted anterior aspect of both LEs. Neuro: alert &  orientedx3, cranial nerves grossly intact. moves all 4 extremities w/o difficulty. Affect flat.   Telemetry: Reviewed, NSR 80-90s    Labs: Basic Metabolic Panel:  Recent Labs Lab 10/10/16 0450  10/12/16 0500 10/13/16 0456 10/14/16 0530 10/15/16 0454 10/15/16 1330 10/16/16 0542  NA 125*  < > 128* 126* 124* 123* 125* 124*  K 4.4  < > 4.4 3.9 4.4 4.4 3.8 4.2  CL 89*  < > 86* 85* 78* 75* 78* 75*  CO2 23  < > 28 28 29 30 29  32  GLUCOSE 112*  < > 111* 123* 142* 133* 119* 92  BUN 50*  < > 57* 57* 67* 80* 85* 97*  CREATININE 3.64*  < > 3.36* 3.32* 3.39* 3.59* 3.50* 3.60*  CALCIUM 7.3*  < > 8.2* 8.4* 8.9 8.6* 8.3* 8.3*  MG 2.1  --   --   --   --   --   --   --   PHOS 6.3*  < > 5.5* 5.2* 5.6* 6.4*  --  5.8*  < > = values in this interval not displayed.  Liver Function Tests:  Recent Labs Lab 10/12/16 0500 10/13/16 0456 10/14/16 0530 10/15/16 0454 10/16/16 0542  ALBUMIN 1.6* 1.4* 1.5* 1.7* 1.7*  CBC:  Recent Labs Lab 10/11/16 0510 10/13/16 0456 10/14/16 0530 10/15/16 0454 10/16/16 0542  WBC 3.6* 3.1* 5.2 4.2 2.6*  HGB 8.6* 8.1* 8.7* 8.1* 7.6*  HCT 25.9* 24.1* 25.8* 24.1* 23.1*  MCV 90.9 90.3 89.6 90.6 90.9  PLT 74* 66* 66* 52* 40*    Cardiac Enzymes: No results for input(s): CKTOTAL, CKMB, CKMBINDEX, TROPONINI in the last 168 hours.  BNP: BNP (last 3 results)  Recent Labs  10/06/16 2101  BNP 1,535.9*    Medications:     Scheduled Medications: . acyclovir  200 mg Oral Daily  . bisacodyl  10 mg Rectal Once  . [START ON 10/17/2016] bortezomib SQ  1.3 mg/m2 Subcutaneous Once  . calcium acetate  667 mg Oral TID WC  . cyanocobalamin  1,000 mcg Intramuscular Daily  . [START ON 10/20/2016] dexamethasone  20 mg Intravenous Daily  . mouth rinse  15 mL Mouth Rinse BID  . pantoprazole  40 mg Oral Daily  . polyethylene glycol  17 g Oral Daily  . sodium chloride flush  10-40 mL Intracatheter Q12H    Infusions: . DOPamine 2.5 mcg/kg/min (10/14/16 1726)     PRN Medications: acetaminophen **OR** acetaminophen, calcium carbonate, camphor-menthol **AND** hydrOXYzine, docusate sodium, docusate sodium, feeding supplement (NEPRO CARB STEADY), ipratropium-albuterol, ondansetron **OR** ondansetron (ZOFRAN) IV, oxyCODONE, sodium chloride flush, sorbitol, traZODone, zolpidem   Assessment:   1. Acute Biventricular Heart Failure 2. Acute on chronic Renal Failure 3. Anemia 4. Multiple Myeloma 5. Hyponatremia   Plan/Discussion:    Has responded well to high dose lasix. CVP 12-13 this am. Somewhat lightheaded.   Will stop lasix and work towards po meds.  Will leave on dopamine and plan on starting torsemide tomorrow, 10/17/16 to let kidneys equilibrate.  Creatinine trending up to 3.6. Diuretics as above. Renal following.  Hyponatremic.  Continue free water restriction.   Hgb 8.7 -> 8.1 -> 7.6. Gt 2 units of PRBC's on 10/10/16 for hbg of 7.0. No overt bleeding. May need additional blood. Will discuss with MD.   Co-ox 84.5% this am. May be falsely elevated. Continue dopamine while transitioning to po diuretics.   Continues to be anxious about cost and his financial responsibilities.   Length of Stay: [place], [place]  10/16/2016, 9:17 AM  Advanced Heart Failure Team Please contact [facility] for night-coverage after hours (4p -7a ) and weekends on [email or web address]  Patient seen and examined with Oda Kilts, PA-C. We discussed all aspects of the encounter. I agree with the assessment and plan as stated above.   CVP remains markedly elevated despite nearly 40 pound diuresis. Agree with stopping lasix. 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 Would continue dopamine until creatinine settles down and then we will wean. I am concerned that we will not be able to manage volume status without IV lasix or dopamine. Prognosis remains very concerning.   Darreon Lutes,  Rithik Odea,MD 3:52 PM

## 2016-10-16 NOTE — Progress Notes (Signed)
Physical Therapy Treatment Patient Details Name: Lance [name] MRN: [id number] DOB: 06-16-1956 Today's Date: 10/16/2016    History of Present Illness Patient presents with acute respiratory failure. PMH: Multpile Myeloma,  HTN, depression, dilated cardiomyopathy     PT Comments    Pt pleasant with difficulty maintaining eyes open throughout session. Pt reports dizziness as a combination of lightheadedness and spinning reporting that he has had symptoms of spinning with transitions OOB at home. Pt with ability to perform visual tracking in all quadrants with nystagmus noted on at end range without production of spinning sensation. Pt mobility currently prevents vestibular assessment but may be beneficial in future sessions to assess vestibular dysfunction. Will continue to follow to maximize function. Sister present and states she and brother will be able to provide 24hr assist as needed. 20-30' is a functional distance for pt to be able to enter home and move room to room.   BP 91/77 (82), HR 89 at rest  With activity 107/ 58 (73) sats 90-97% on RA    Follow Up Recommendations  Home health PT;Supervision/Assistance - 24 hour     Equipment Recommendations  Rolling walker with 5" wheels    Recommendations for Other Services       Precautions / Restrictions Precautions Precautions: Fall Restrictions Weight Bearing Restrictions: No    Mobility  Bed Mobility               General bed mobility comments: in chair on arrival  Transfers Overall transfer level: Needs assistance   Transfers: Sit to/from Stand Sit to Stand: Min guard         General transfer comment: cues for hand placement  Ambulation/Gait Ambulation/Gait assistance: Min guard Ambulation Distance (Feet): 30 Feet Assistive device: Rolling walker (2 wheeled) Gait Pattern/deviations: Step-through pattern;Decreased stride length;Trunk flexed   Gait velocity interpretation: Below normal speed for  age/gender General Gait Details: slow gait with frequent short pauses with gait. pt fatigued with 30' with report of dizziness throughout without BP drop with chair required to be pulled to pt end of gait. pt with cues for gaze stabilization to attempt to improve dizziness   Stairs            Wheelchair Mobility    Modified Rankin (Stroke Patients Only)       Balance Overall balance assessment: Needs assistance   Sitting balance-Leahy Scale: Good       Standing balance-Leahy Scale: Fair                      Cognition Arousal/Alertness: Awake/alert Behavior During Therapy: WFL for tasks assessed/performed Overall Cognitive Status: Within Functional Limits for tasks assessed                 General Comments: pt with slight difficulty maintaining eyes open but alert and responsive throughout    Exercises General Exercises - Lower Extremity Long Arc Quad: AROM;Both;Seated;15 reps Hip Flexion/Marching: AROM;15 reps;Both;Seated    General Comments        Pertinent Vitals/Pain Pain Assessment: No/denies pain    Home Living                      Prior Function            PT Goals (current goals can now be found in the care plan section) Progress towards PT goals: Progressing toward goals    Frequency           PT  Plan Current plan remains appropriate    Co-evaluation             End of Session Equipment Utilized During Treatment: Gait belt Activity Tolerance: Patient limited by fatigue Patient left: in chair;with call bell/phone within reach;with family/visitor present Nurse Communication: Mobility status       Time: 1040-1111 PT Time Calculation (min) (ACUTE ONLY): 31 min  Charges:  $Gait Training: 8-22 mins $Therapeutic Exercise: 8-22 mins                    G Codes:       Ahria Slappey B Dajuan Turnley 11-03-2016, 12:03 PM  Elwyn Reach, [place]

## 2016-10-17 LAB — RENAL FUNCTION PANEL
Albumin: 1.7 g/dL — ABNORMAL LOW (ref 3.5–5.0)
Anion gap: 18 — ABNORMAL HIGH (ref 5–15)
BUN: 115 mg/dL — AB (ref 6–20)
CHLORIDE: 75 mmol/L — AB (ref 101–111)
CO2: 30 mmol/L (ref 22–32)
CREATININE: 3.68 mg/dL — AB (ref 0.61–1.24)
Calcium: 7.9 mg/dL — ABNORMAL LOW (ref 8.9–10.3)
GFR calc Af Amer: 19 mL/min — ABNORMAL LOW (ref >60)
GFR calc non Af Amer: 16 mL/min — ABNORMAL LOW (ref >60)
Glucose, Bld: 95 mg/dL (ref 65–99)
Phosphorus: 5 mg/dL — ABNORMAL HIGH (ref 2.5–4.6)
Potassium: 3.9 mmol/L (ref 3.5–5.1)
Sodium: 123 mmol/L — ABNORMAL LOW (ref 135–145)

## 2016-10-17 LAB — CBC
HCT: 23.5 % — ABNORMAL LOW (ref 39.0–52.0)
Hemoglobin: 8 g/dL — ABNORMAL LOW (ref 13.0–17.0)
MCH: 30.4 pg (ref 26.0–34.0)
MCHC: 34 g/dL (ref 30.0–36.0)
MCV: 89.4 fL (ref 78.0–100.0)
PLATELETS: 38 10*3/uL — AB (ref 150–400)
RBC: 2.63 MIL/uL — ABNORMAL LOW (ref 4.22–5.81)
RDW: 18.8 % — AB (ref 11.5–15.5)
WBC: 3.3 10*3/uL — AB (ref 4.0–10.5)

## 2016-10-17 NOTE — Progress Notes (Signed)
Daily Progress Note   Patient Name: Lance [name]       Date: 10/17/2016 DOB: Dec 20, 1955  Age: 61 y.o. MRN#: [id number] Attending Physician: Rosita Fire, MD Primary Care Physician: No primary care provider on file. Admit Date: 10/06/2016  Reason for Consultation/Follow-up: Establishing goals of care and Interfamily conflict  Subjective: Patient sitting up in chair. Appears comfortable. Eating breakfast. He has started his Velcade and is tolerating well so far. Abdominal pain is still present, but relieved with current medications. Sister, Sharyn Lull, continues to keep vigil at bedside. He continues to diurese, lasix stopped d/t AKI. On dopamine. Per cardiology, appears to be maximized, however, there is concern for recurrent volume accumulation without IV lasix and dopamine. Patient wishes to continue current course of therapy. Note- he cannot go home with Hospice on Velcade.    Review of Systems  Constitutional: Positive for malaise/fatigue.  Respiratory: Positive for shortness of breath and wheezing.   Cardiovascular: Negative for chest pain.  Gastrointestinal: Positive for abdominal pain.  Musculoskeletal: Positive for back pain.  Psychiatric/Behavioral: Negative for depression.    Length of Stay: 11  Current Medications: Scheduled Meds:  . acyclovir  200 mg Oral Daily  . bortezomib SQ  1.3 mg/m2 Subcutaneous Once  . calcium acetate  667 mg Oral TID WC  . cyanocobalamin  1,000 mcg Intramuscular Daily  . [START ON 10/20/2016] dexamethasone  20 mg Intravenous Daily  . docusate sodium  100 mg Oral BID  . mouth rinse  15 mL Mouth Rinse BID  . pantoprazole  40 mg Oral Daily  . polyethylene glycol  17 g Oral Daily  . sodium chloride flush  10-40 mL Intracatheter Q12H     Continuous Infusions: . DOPamine 2.5 mcg/kg/min (10/17/16 0616)    PRN Meds: acetaminophen **OR** acetaminophen, calcium carbonate, camphor-menthol **AND** hydrOXYzine, docusate sodium, feeding supplement (NEPRO CARB STEADY), ipratropium-albuterol, ondansetron **OR** ondansetron (ZOFRAN) IV, oxyCODONE, sodium chloride, sodium chloride flush, sorbitol, traZODone, zolpidem  Physical Exam  Constitutional: He is oriented to person, place, and time. He appears well-developed and well-nourished.  Cardiovascular:  Diffuse anasarca, heart sounds distant  Pulmonary/Chest: He has wheezes.  Abdominal: He exhibits distension. There is tenderness.  Musculoskeletal: He exhibits edema.  Neurological: He is oriented to person, place, and time.  Drowsy, but able to participate meaningfully in conversation  Vital Signs: BP (!) 117/55 (BP Location: Left Arm)   Pulse 77   Temp 97.5 F (36.4 C) (Oral)   Resp 14   Ht 5' 10" (1.778 m)   Wt 122.2 kg (269 lb 4.8 oz)   SpO2 92%   BMI 38.64 kg/m  SpO2: SpO2: 92 % O2 Device: O2 Device: Not Delivered O2 Flow Rate: O2 Flow Rate (L/min): 2 L/min  Intake/output summary:   Intake/Output Summary (Last 24 hours) at 10/17/16 1022 Last data filed at 10/17/16 0700  Gross per 24 hour  Intake           1003.4 ml  Output             2650 ml  Net          -1646.6 ml   LBM: Last BM Date: 10/09/16 Baseline Weight: Weight: (!) 143.6 kg (316 lb 9.3 oz) Most recent weight: Weight: 122.2 kg (269 lb 4.8 oz)       Palliative Assessment/Data: PPS:  30%    Flowsheet Rows   Flowsheet Row Most Recent Value  Intake Tab  Referral Department  Hospitalist  Unit at Time of Referral  Med/Surg Unit  Palliative Care Primary Diagnosis  Cancer  Date Notified  10/09/16  Palliative Care Type  New Palliative care  Reason for referral  Clarify Goals of Care  Date of Admission  10/06/16  # of days IP prior to Palliative referral  3  Clinical Assessment   Psychosocial & Spiritual Assessment  Palliative Care Outcomes      Patient Active Problem List   Diagnosis Date Noted  . Hyponatremia   . Thrombocytopenia (HCC)   . Bleeding diathesis (HCC)   . Advance care planning   . Goals of care, counseling/discussion   . Palliative care by specialist   . Cardiac amyloidosis (HCC)   . Acute respiratory failure with hypoxia (HCC) 10/07/2016  . Hyperkalemia 10/07/2016  . Hyperphosphatemia 10/07/2016  . Hypocalcemia 10/07/2016  . Antineoplastic chemotherapy induced pancytopenia (HCC) 10/07/2016  . Multiple myeloma not having achieved remission (HCC) 10/07/2016  . Chest pain   . Acute on chronic combined systolic and diastolic congestive heart failure (HCC) 10/06/2016  . ARF (acute renal failure) (HCC) 10/06/2016  . HTN (hypertension) 10/06/2016  . Anasarca 10/06/2016    Palliative Care Assessment & Plan   Patient Profile: 60 y.o. 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 He was transferred to [facility] from [facility] for nephrology consult d/t AKI in setting of sever CHF and volume overload not responding to high-dose diuretics. 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 Now starting velcade and dexamethasone treatment for multiple myeloma. Palliative medicine consulted for GOC.  Assessment/Recommendations/Plan   DNR  PMT will continue to follow and address GOC   Code Status:  DNR  Prognosis:   < 3 months may anticipate hospital death if he does not begin to improve    Discharge Planning:  To Be Determined  Care plan was discussed with patient.   Thank you for allowing the Palliative Medicine Team to assist in the care of this patient.   Total time: 15 min  Greater than 50%  of this time was spent counseling and coordinating care related to the above assessment and plan.  Kasie Mahan,  AGNP-C Palliative Medicine   Please contact Palliative Medicine Team phone at [phone] for questions and concerns.        

## 2016-10-17 NOTE — Progress Notes (Signed)
Velcade will be given once premeds are ordered and 30 mins after they are given. Christin RN will notify the IV team once premeds are given. Catalina Pizza

## 2016-10-17 NOTE — Progress Notes (Signed)
Patient ID: Alexis Mizuno, male   DOB: [birth year], 61 y.o.   MRN: [id number] S:No new complaints O:BP (!) 155/56 (BP Location: Left Arm)   Pulse 74   Temp 97.6 F (36.4 C) (Oral)   Resp 14   Ht 5' 10"  (1.778 m)   Wt 122.2 kg (269 lb 4.8 oz)   SpO2 93%   BMI 38.64 kg/m   Intake/Output Summary (Last 24 hours) at 10/17/16 1407 Last data filed at 10/17/16 1115  Gross per 24 hour  Intake           1267.5 ml  Output             2800 ml  Net          -1532.5 ml   Intake/Output: I/O last 3 completed shifts: In: 1958.3 [P.O.:1366; I.V.:328.3; IV Piggyback:264] Out: 7782 [Urine:4575]  Intake/Output this shift:  Total I/O In: 480 [P.O.:480] Out: 800 [Urine:800] Weight change: -1.905 kg (-4 lb 3.2 oz) UMP:NTIRW WM who appears very fatigued CVS:no rub Resp:decreased bs at bases ERX:VQMGQQPYP/PJKDT/OIZTI Ext:+edema   Recent Labs Lab 10/11/16 0510 10/12/16 0500 10/13/16 0456 10/14/16 0530 10/15/16 0454 10/15/16 1330 10/16/16 0542 10/17/16 0430  NA 126* 128* 126* 124* 123* 125* 124* 123*  K 4.4 4.4 3.9 4.4 4.4 3.8 4.2 3.9  CL 85* 86* 85* 78* 75* 78* 75* 75*  CO2 25 28 28 29 30 29  32 30  GLUCOSE 95 111* 123* 142* 133* 119* 92 95  BUN 54* 57* 57* 67* 80* 85* 97* 115*  CREATININE 3.64* 3.36* 3.32* 3.39* 3.59* 3.50* 3.60* 3.68*  ALBUMIN 1.6* 1.6* 1.4* 1.5* 1.7*  --  1.7* 1.7*  CALCIUM 7.9* 8.2* 8.4* 8.9 8.6* 8.3* 8.3* 7.9*  PHOS 5.6* 5.5* 5.2* 5.6* 6.4*  --  5.8* 5.0*   Liver Function Tests:  Recent Labs Lab 10/15/16 0454 10/16/16 0542 10/17/16 0430  ALBUMIN 1.7* 1.7* 1.7*   No results for input(s): LIPASE, AMYLASE in the last 168 hours. No results for input(s): AMMONIA in the last 168 hours. CBC:  Recent Labs Lab 10/13/16 0456 10/14/16 0530 10/15/16 0454 10/16/16 0542 10/17/16 0430  WBC 3.1* 5.2 4.2 2.6* 3.3*  HGB 8.1* 8.7* 8.1* 7.6* 8.0*  HCT 24.1* 25.8* 24.1* 23.1* 23.5*  MCV 90.3 89.6 90.6 90.9 89.4  PLT 66* 66* 52* 40* 38*   Cardiac Enzymes: No  results for input(s): CKTOTAL, CKMB, CKMBINDEX, TROPONINI in the last 168 hours. CBG:  Recent Labs Lab 10/16/16 1133  GLUCAP 105*    Iron Studies: No results for input(s): IRON, TIBC, TRANSFERRIN, FERRITIN in the last 72 hours. Studies/Results: No results found. Marland Kitchen acyclovir  200 mg Oral Daily  . calcium acetate  667 mg Oral TID WC  . cyanocobalamin  1,000 mcg Intramuscular Daily  . [START ON 10/20/2016] dexamethasone  20 mg Intravenous Daily  . docusate sodium  100 mg Oral BID  . mouth rinse  15 mL Mouth Rinse BID  . pantoprazole  40 mg Oral Daily  . polyethylene glycol  17 g Oral Daily  . sodium chloride flush  10-40 mL Intracatheter Q12H    BMET    Component Value Date/Time   NA 123 (L) 10/17/2016 0430   K 3.9 10/17/2016 0430   CL 75 (L) 10/17/2016 0430   CO2 30 10/17/2016 0430   GLUCOSE 95 10/17/2016 0430   BUN 115 (H) 10/17/2016 0430   CREATININE 3.68 (H) 10/17/2016 0430   CALCIUM 7.9 (L) 10/17/2016 0430   GFRNONAA 16 (  L) 10/17/2016 0430   GFRAA 19 (L) 10/17/2016 0430   CBC    Component Value Date/Time   WBC 3.3 (L) 10/17/2016 0430   RBC 2.63 (L) 10/17/2016 0430   HGB 8.0 (L) 10/17/2016 0430   HCT 23.5 (L) 10/17/2016 0430   PLT 38 (L) 10/17/2016 0430   MCV 89.4 10/17/2016 0430   MCH 30.4 10/17/2016 0430   MCHC 34.0 10/17/2016 0430   RDW 18.8 (H) 10/17/2016 0430   LYMPHSABS 0.7 10/08/2016 0147   MONOABS 0.5 10/08/2016 0147   EOSABS 0.0 10/08/2016 0147   BASOSABS 0.0 10/08/2016 0147     Assessment/Plan:  1. AKI of unclear etiology.  Scr was 1.16 on 09/26/16 and upt to 1.87 on 10/02/16 and now up to 3.5.  DDx includes myeloma or amyloidosis involvement of kidneys, acute GN, cardiorenal syndrome, AIN.   1. No indication for dialysis at this time, however BUN is rising.  May need to back off of diuretics. 2. Multiple myeloma- per Heme  velcade and dexamethasone. 3. 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  Currently on dopamine gtt.  Cardiology working  up. 1. Diuresing with lasix and pressors.   4. Morbid ovesity 5. Pancytopenia- due to #2  Donetta Potts, MD [facility] [phone]

## 2016-10-17 NOTE — Progress Notes (Signed)
PROGRESS NOTE    Lance [name]  FOY:[id number] DOB: [birth year] DOA: 10/06/2016 PCP: No primary care provider on file.   Brief Narrative: 61 y.o.gentleman with a history of transfusion dependent multiple myeloma, systolic heart failure with EF 35-40% in January 2018, pancytopenia, and HTN who was admitted to [facility] on 10/04/2016 for management of CHF exacerbation. The patient has required several transfusions in the past 30 days. He presented with shortness of breath and anasarca. Transferred from [facility] for nephrology consultation. At [place], BNP was 13000. He had a CT of the abdomen and pelvis without contrast that was negative for hydronephrosis and kidney stones.. Moderate ascites noted. Chest xray showed cardiomegaly, mild CHF, and small bilateral effusions. Lower extremity dopplers were negative for DVT.  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 Echocardiogram with EF of 35% with diffuse hypokinesis. -Treated with IV diuretics with net negative of 24.5 L since admission. Continue to hold diuretics as per heart failure team. Serum creatinine level elevated but stable today.  -Currently on IV dopamine for inotropic support. Continue as per heart failure team. Medicine to resume oral Lasix when renal function is stabilized. -Cardiology consult appreciated  #Acute kidney injury on chronic kidney disease likely stage III: Likely in the setting of multiple myeloma and congestive heart failure. -Serum creatinine level elevated to 3.6, stable today compared to yesterday. Off diuretics now. Continue to monitor BMP and urine output.  -Nephrology consult appreciated. Continue to monitor renal function. Avoid nephrotoxins.  #Acute hypoxic respiratory failure in the setting of CHF: - Continue to monitor. Requiring about 2 L of  oxygen.  #History of multiple myeloma: Patient was followed by oncologist outpatient and was treated recently. Patient has Lambda ratio of 791. -Started chemotherapy including Velcade, steroid, acyclovir from 2/16. Second dose today. -Monitor hemoglobin, received 2 units of red blood cell transfusion on 2/13.  -Oncology consult appreciated.  #Pancytopenia likely in the setting of chemotherapy. Continue to monitor CBC. Follow-up oncologist. Platelet count of 38 today.  #Hyponatremia in the setting of congestive heart failure and diuretics (metolazone). Serum sodium level 123. Off diuretics now. Continue to do fluid restriction. Monitor BMP. If no improvement patient may benefit from tolvaptan.  # Goals of care discussion: Palliative care consult appreciated. Patient is DO NOT RESUSCITATE. Patient understand the severity of illness.   #Left-sided back pain: Back x-ray unremarkable. Constipation improved after bowel regimen and enema.  DVT prophylaxis: SCD. No anticoagulation because of pancytopenia. Code Status: Full code Family Communication: Patient's sister at bedside Disposition Plan: Likely discharge home in 3-4 days.    Consultants:   Cardiologist  Nephrologist  Palliative care  Oncologist  Procedures:  Chest x-ray 10/06/2016  2-D echo  10/07/2016  Renal ultrasound 10/07/2016  PICC line 10/09/2016 Antimicrobials: None Subjective: Patient was seen and examined at bedside. No new event. Feels weak. Denied headache, dizziness, nausea, vomiting, chest pain or shortness of breath.  Objective: Vitals:   10/17/16 0023 10/17/16 0415 10/17/16 0445 10/17/16 0753  BP: 117/62 (!) 116/58  (!) 117/55  Pulse: 78 77    Resp: 16 14    Temp: 97.6 F (36.4 C) 97.7 F (36.5 C)  97.5 F (36.4 C)  TempSrc: Oral Oral  Oral  SpO2: 94% 93%  92%  Weight:   122.2 kg (269 lb 4.8 oz)   Height:        Intake/Output Summary (  Last 24 hours) at 10/17/16 1050 Last data filed at  10/17/16 0830  Gross per 24 hour  Intake           1263.5 ml  Output             2650 ml  Net          -1386.5 ml   Filed Weights   10/15/16 0504 10/16/16 0416 10/17/16 0445  Weight: 124 kg (273 lb 4.8 oz) 124.1 kg (273 lb 8 oz) 122.2 kg (269 lb 4.8 oz)    Examination:  General exam: Sitting on chair comfortably, not in distress Respiratory system: Clear to bilateral, respiratory effort normal, no wheezing Cardiovascular system: Regular rate and rhythm, S1-S2 normal Gastrointestinal system: Abdomen soft, bowel sound positive, abdominal distention unchanged. Central nervous system: Alert and oriented. No focal neurological deficits. Extremities: Bilateral lower extremities was wrapped, unchanged Skin: No rashes, lesions or ulcers Psychiatry: Judgement and insight appear normal. Mood & affect appropriate.     Data Reviewed: I have personally reviewed following labs and imaging studies  CBC:  Recent Labs Lab 10/13/16 0456 10/14/16 0530 10/15/16 0454 10/16/16 0542 10/17/16 0430  WBC 3.1* 5.2 4.2 2.6* 3.3*  HGB 8.1* 8.7* 8.1* 7.6* 8.0*  HCT 24.1* 25.8* 24.1* 23.1* 23.5*  MCV 90.3 89.6 90.6 90.9 89.4  PLT 66* 66* 52* 40* 38*   Basic Metabolic Panel:  Recent Labs Lab 10/13/16 0456 10/14/16 0530 10/15/16 0454 10/15/16 1330 10/16/16 0542 10/17/16 0430  NA 126* 124* 123* 125* 124* 123*  K 3.9 4.4 4.4 3.8 4.2 3.9  CL 85* 78* 75* 78* 75* 75*  CO2 28 29 30 29  32 30  GLUCOSE 123* 142* 133* 119* 92 95  BUN 57* 67* 80* 85* 97* 115*  CREATININE 3.32* 3.39* 3.59* 3.50* 3.60* 3.68*  CALCIUM 8.4* 8.9 8.6* 8.3* 8.3* 7.9*  PHOS 5.2* 5.6* 6.4*  --  5.8* 5.0*   GFR: Estimated Creatinine Clearance: 27.3 mL/min (by C-G formula based on SCr of 3.68 mg/dL (H)). Liver Function Tests:  Recent Labs Lab 10/13/16 0456 10/14/16 0530 10/15/16 0454 10/16/16 0542 10/17/16 0430  ALBUMIN 1.4* 1.5* 1.7* 1.7* 1.7*   No results for input(s): LIPASE, AMYLASE in the last 168 hours. No  results for input(s): AMMONIA in the last 168 hours. Coagulation Profile:  Recent Labs Lab 10/11/16 1505  INR 1.96   Cardiac Enzymes: No results for input(s): CKTOTAL, CKMB, CKMBINDEX, TROPONINI in the last 168 hours. BNP (last 3 results) No results for input(s): PROBNP in the last 8760 hours. HbA1C: No results for input(s): HGBA1C in the last 72 hours. CBG:  Recent Labs Lab 10/16/16 1133  GLUCAP 105*   Lipid Profile: No results for input(s): CHOL, HDL, LDLCALC, TRIG, CHOLHDL, LDLDIRECT in the last 72 hours. Thyroid Function Tests: No results for input(s): TSH, T4TOTAL, FREET4, T3FREE, THYROIDAB in the last 72 hours. Anemia Panel: No results for input(s): VITAMINB12, FOLATE, FERRITIN, TIBC, IRON, RETICCTPCT in the last 72 hours. Sepsis Labs: No results for input(s): PROCALCITON, LATICACIDVEN in the last 168 hours.  Recent Results (from the past 240 hour(s))  Urine culture     Status: None   Collection Time: 10/07/16 10:10 PM  Result Value Ref Range Status   Specimen Description URINE, RANDOM  Final   Special Requests ADDED 0523 10/08/16  Final   Culture NO GROWTH  Final   Report Status 10/09/2016 FINAL  Final  MRSA PCR Screening     Status: None   Collection Time:  10/09/16  1:26 PM  Result Value Ref Range Status   MRSA by PCR NEGATIVE NEGATIVE Final    Comment:        The GeneXpert MRSA Assay (FDA approved for NASAL specimens only), is one component of a comprehensive MRSA colonization surveillance program. It is not intended to diagnose MRSA infection nor to guide or monitor treatment for MRSA infections.          Radiology Studies: Dg Lumbar Spine 1 View  Result Date: 10/15/2016 CLINICAL DATA:  61 year old male with back pain and a history of transfusion dependent multiple myeloma. Admitted to [facility] earlier this month, transferred for nephrology consultation. EXAM: LUMBAR SPINE - 1 VIEW COMPARISON:  [facility] CT Abdomen and Pelvis  10/05/2016. [facility] radiographic bone survey 07/15/2014. FINDINGS: Standing AP view of the lumbar spine. Normal lumbar segmentation. This single view is most easily compared to the 2015 bone survey AP view of the lumbar spine. Chronic mild L1 and L2 compression appears stable since that time. L3 through L5 vertebral body height appears stable. Visible lower thoracic levels appear grossly stable, see also thoracic spine series from today reported separately. IMPRESSION: Stable AP radiographic appearance of lumbar spine since 2015. Electronically Signed   By: Genevie Ann M.D.   On: 10/15/2016 14:09   Dg Thoracic Spine 1 View  Result Date: 10/15/2016 CLINICAL DATA:  61 year old male with back pain and a history of transfusion dependent multiple myeloma. Admitted to [facility] earlier this month, transferred for nephrology consultation. EXAM: THORACIC SPINE 1 VIEW COMPARISON:  [facility] portable chest radiograph 08/27/2016 and radiographic skeletal survey 07/15/2014. Standing AP view of the lumbar spine today reported separately. FINDINGS: Normal thoracic segmentation. AP thoracic vertebral height and alignment appears stable since 2015. Bone mineralization appears stable. Streaky left lung base opacity and small left pleural effusion are evident. Partially visible right PICC line or central line. IMPRESSION: 1. Stable AP radiographic appearance of the thoracic spine since 2015. 2. Partially visible left pleural effusion and streaky left lung base opacity. Electronically Signed   By: Genevie Ann M.D.   On: 10/15/2016 14:12        Scheduled Meds: . acyclovir  200 mg Oral Daily  . bortezomib SQ  1.3 mg/m2 Subcutaneous Once  . calcium acetate  667 mg Oral TID WC  . cyanocobalamin  1,000 mcg Intramuscular Daily  . [START ON 10/20/2016] dexamethasone  20 mg Intravenous Daily  . docusate sodium  100 mg Oral BID  . mouth rinse  15 mL Mouth Rinse BID  . pantoprazole  40 mg Oral Daily  .  polyethylene glycol  17 g Oral Daily  . sodium chloride flush  10-40 mL Intracatheter Q12H   Continuous Infusions: . DOPamine 2.5 mcg/kg/min (10/17/16 0616)     LOS: 11 days    Zell Doucette Tanna Furry, MD [facility] Pager [phone]  If 7PM-7AM, please contact night-coverage [email or web address] Password [email or web address] 10/17/2016, 10:50 AM

## 2016-10-17 NOTE — Plan of Care (Signed)
Problem: Education: Goal: Knowledge of [facility] Education information/materials will improve Outcome: Progressing Patient aware of plan of care.  RN instructed patient to call and wait for staff assistance prior to getting out of chair.  Patient stated understanding.

## 2016-10-17 NOTE — Progress Notes (Addendum)
Advanced Heart Failure Rounding Note   Subjective:   61 y/o male with apparent longstanding multiple myeloma (per Dr. 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   2/12 started dopamine.  Lasix stopped yesterday due to AKI. BUN and creatinine continue to rise. Now 115/3.68. Co-ox 84%  Getting treatment for multiple myeloma with Velcade and dexamethasone . Fatigued.  CVP 11-12.    Objective:    Vital Signs:   Temp:  [97.6 F (36.4 C)-98.3 F (36.8 C)] 97.7 F (36.5 C) (02/20 0415) Pulse Rate:  [77-89] 77 (02/20 0415) Resp:  [14-18] 14 (02/20 0415) BP: (107-119)/(47-62) 116/58 (02/20 0415) SpO2:  [90 %-95 %] 93 % (02/20 0415) Weight:  [122.2 kg (269 lb 4.8 oz)] 122.2 kg (269 lb 4.8 oz) (02/20 0445) Last BM Date: 10/09/16  Weight change: Filed Weights   10/15/16 0504 10/16/16 0416 10/17/16 0445  Weight: 124 kg (273 lb 4.8 oz) 124.1 kg (273 lb 8 oz) 122.2 kg (269 lb 4.8 oz)    Intake/Output:   Intake/Output Summary (Last 24 hours) at 10/17/16 0616 Last data filed at 10/17/16 0440  Gross per 24 hour  Intake              843 ml  Output             2650 ml  Net            -1807 ml     Physical Exam: General: Seated in chair. Fatigued appearing.   HEENT: normal, R periorbital eccyhmosis Neck: supple. JVP to jaw Carotids 2+ bilat; no bruits. No thyromegaly or nodule noted.  Cor: PMI nondisplaced. RRR. No rubs, gallops 2/6 TR Lungs: Diminished throughout, on RA.   Abdomen: obese, soft, NT, ND, no HSM. No bruits or masses. +BS  Extremities: no cyanosis, clubbing, rash, R and LLE 2+ edema R and LLE erythema noted anterior aspect of both LEs. Neuro: alert & orientedx3, cranial nerves grossly intact. moves all 4 extremities w/o difficulty. Affect flat.   Telemetry: Reviewed, NSR 80-90s    Labs: Basic  Metabolic Panel:  Recent Labs Lab 10/13/16 0456 10/14/16 0530 10/15/16 0454 10/15/16 1330 10/16/16 0542 10/17/16 0430  NA 126* 124* 123* 125* 124* 123*  K 3.9 4.4 4.4 3.8 4.2 3.9  CL 85* 78* 75* 78* 75* 75*  CO2 28 29 30 29  32 30  GLUCOSE 123* 142* 133* 119* 92 95  BUN 57* 67* 80* 85* 97* 115*  CREATININE 3.32* 3.39* 3.59* 3.50* 3.60* 3.68*  CALCIUM 8.4* 8.9 8.6* 8.3* 8.3* 7.9*  PHOS 5.2* 5.6* 6.4*  --  5.8* 5.0*    Liver Function Tests:  Recent Labs Lab 10/13/16 0456 10/14/16 0530 10/15/16 0454 10/16/16 0542 10/17/16 0430  ALBUMIN 1.4* 1.5* 1.7* 1.7* 1.7*    CBC:  Recent Labs Lab 10/13/16 0456 10/14/16 0530 10/15/16 0454 10/16/16 0542 10/17/16 0430  WBC 3.1* 5.2 4.2 2.6* 3.3*  HGB 8.1* 8.7* 8.1* 7.6* 8.0*  HCT 24.1* 25.8* 24.1* 23.1* 23.5*  MCV 90.3 89.6 90.6 90.9 89.4  PLT 66* 66* 52* 40* 38*    Cardiac Enzymes: No results for input(s): CKTOTAL, CKMB, CKMBINDEX, TROPONINI in the last 168 hours.  BNP: BNP (last 3 results)  Recent Labs  10/06/16 2101  BNP 1,535.9*    Medications:     Scheduled Medications: . acyclovir  200 mg Oral Daily  . bortezomib SQ  1.3 mg/m2 Subcutaneous Once  . calcium acetate  667 mg Oral TID WC  . cyanocobalamin  1,000 mcg Intramuscular Daily  . [START ON 10/20/2016] dexamethasone  20 mg Intravenous Daily  . docusate sodium  100 mg Oral BID  . mouth rinse  15 mL Mouth Rinse BID  . pantoprazole  40 mg Oral Daily  . polyethylene glycol  17 g Oral Daily  . sodium chloride flush  10-40 mL Intracatheter Q12H    Infusions: . DOPamine 2.5 mcg/kg/min (10/16/16 1005)    PRN Medications: acetaminophen **OR** acetaminophen, calcium carbonate, camphor-menthol **AND** hydrOXYzine, docusate sodium, feeding supplement (NEPRO CARB STEADY), ipratropium-albuterol, ondansetron **OR** ondansetron (ZOFRAN) IV, oxyCODONE, sodium chloride, sodium chloride flush, sorbitol, traZODone, zolpidem   Assessment:   1. Acute  Biventricular Heart Failure 2. Acute on chronic Renal Failure 3. Anemia 4. Multiple Myeloma 5. Hyponatremia 6. Pancytopenia    Plan/Discussion:    CVP improved. Now 11-12 now s/p 47 pound diuresis. BUN/Creatine slightly worse. Continue to hold diuretics. Will continue dopamine one more day to help renal function stabilize. Sodium stable 123-124 range. Continue free water restriction.   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 I am concerned that we will not be able to manage volume status without IV lasix or dopamine. Prognosis remains very concerning.   Length of Stay: 11  Glori Bickers, MD  10/17/2016, 6:16 AM  Advanced Heart Failure Team Please contact [facility] for night-coverage after hours (4p -7a ) and weekends on [email or web address]

## 2016-10-18 LAB — RENAL FUNCTION PANEL
ANION GAP: 17 — AB (ref 5–15)
Albumin: 1.7 g/dL — ABNORMAL LOW (ref 3.5–5.0)
BUN: 117 mg/dL — ABNORMAL HIGH (ref 6–20)
CHLORIDE: 76 mmol/L — AB (ref 101–111)
CO2: 29 mmol/L (ref 22–32)
Calcium: 8.1 mg/dL — ABNORMAL LOW (ref 8.9–10.3)
Creatinine, Ser: 3.84 mg/dL — ABNORMAL HIGH (ref 0.61–1.24)
GFR calc non Af Amer: 15 mL/min — ABNORMAL LOW (ref >60)
GFR, EST AFRICAN AMERICAN: 18 mL/min — AB (ref >60)
GLUCOSE: 86 mg/dL (ref 65–99)
Phosphorus: 4.8 mg/dL — ABNORMAL HIGH (ref 2.5–4.6)
Potassium: 3.8 mmol/L (ref 3.5–5.1)
Sodium: 122 mmol/L — ABNORMAL LOW (ref 135–145)

## 2016-10-18 LAB — CBC
HEMATOCRIT: 22.9 % — AB (ref 39.0–52.0)
HEMOGLOBIN: 7.7 g/dL — AB (ref 13.0–17.0)
MCH: 30.2 pg (ref 26.0–34.0)
MCHC: 33.6 g/dL (ref 30.0–36.0)
MCV: 89.8 fL (ref 78.0–100.0)
PLATELETS: 35 10*3/uL — AB (ref 150–400)
RBC: 2.55 MIL/uL — AB (ref 4.22–5.81)
RDW: 18.9 % — ABNORMAL HIGH (ref 11.5–15.5)
WBC: 3.6 10*3/uL — ABNORMAL LOW (ref 4.0–10.5)

## 2016-10-18 MED ORDER — VITAMIN B-12 1000 MCG PO TABS
1000.0000 ug | ORAL_TABLET | Freq: Every day | ORAL | Status: DC
  Administered 2016-10-18 – 2016-10-20 (×4): 1000 ug via ORAL
  Filled 2016-10-18 (×3): qty 1

## 2016-10-18 MED ORDER — ONDANSETRON HCL 4 MG/2ML IJ SOLN: 4.0000 mg | Freq: Once | INTRAMUSCULAR | Status: DC | PRN

## 2016-10-18 MED ORDER — BORTEZOMIB CHEMO SQ INJECTION 3.5 MG (2.5MG/ML): 1.3000 mg/m2 | Freq: Once | INTRAMUSCULAR | Status: DC

## 2016-10-18 MED ORDER — DEXAMETHASONE SODIUM PHOSPHATE 10 MG/ML IJ SOLN
20.0000 mg | Freq: Every day | INTRAMUSCULAR | Status: AC
  Administered 2016-10-20 (×2): 20 mg via INTRAVENOUS
  Filled 2016-10-18 (×2): qty 2

## 2016-10-18 NOTE — Progress Notes (Signed)
Physical Therapy Treatment Patient Details Name: Lance [name] MRN: [id number] DOB: 1956/08/18 Today's Date: 10/18/2016    History of Present Illness Patient presents with acute respiratory failure. PMH: Multpile Myeloma,  HTN, depression, dilated cardiomyopathy     PT Comments    Pt is up to walk with significant time to get there, but minimal help.  Has been at home alone and anticipates family assistance to manage his needs and safety.  Pt had a shorter walk today but is feeling a bit weak, has a minor nosebleed and is up in the chair with some back pain.  Have mobilized and repositioned to ease his symptoms.  Continue acutely as needed for strength and mobility to ease transition home.   Follow Up Recommendations  Home health PT;Supervision/Assistance - 24 hour     Equipment Recommendations  Rolling walker with 5" wheels    Recommendations for Other Services       Precautions / Restrictions Precautions Precautions: Fall (telemetry) Restrictions Weight Bearing Restrictions: No    Mobility  Bed Mobility               General bed mobility comments: in chair on arrival  Transfers Overall transfer level: Needs assistance Equipment used: Rolling walker (2 wheeled);1 person hand held assist Transfers: Sit to/from Stand Sit to Stand: Min guard;Min assist         General transfer comment: reminders for hand placement and safety  Ambulation/Gait Ambulation/Gait assistance: Min guard Ambulation Distance (Feet): 45 Feet Assistive device: Rolling walker (2 wheeled) Gait Pattern/deviations: Step-through pattern;Decreased stride length;Wide base of support (very slow progression) Gait velocity: reduced Gait velocity interpretation: Below normal speed for age/gender General Gait Details: halting pattern with deep breaths, not dizzy or light headed   Financial trader Rankin (Stroke Patients Only)       Balance  Overall balance assessment: Needs assistance Sitting-balance support: Feet supported Sitting balance-Leahy Scale: Good   Postural control: Posterior lean Standing balance support: Bilateral upper extremity supported Standing balance-Leahy Scale: Fair Standing balance comment: fair on walker and less than fair dynamically                    Cognition Arousal/Alertness: Awake/alert Behavior During Therapy: WFL for tasks assessed/performed Overall Cognitive Status: Within Functional Limits for tasks assessed                 General Comments: pt with slight difficulty maintaining eyes open but alert and responsive throughout    Exercises      General Comments        Pertinent Vitals/Pain Pain Assessment: No/denies pain    Home Living                      Prior Function            PT Goals (current goals can now be found in the care plan section) Acute Rehab PT Goals Patient Stated Goal: to go home  Progress towards PT goals: Progressing toward goals    Frequency    Min 3X/week      PT Plan Current plan remains appropriate    Co-evaluation             End of Session Equipment Utilized During Treatment: Gait belt Activity Tolerance: Patient limited by fatigue Patient left: in chair;with call bell/phone within reach Nurse Communication: Mobility status PT Visit  Diagnosis: Unsteadiness on feet (R26.81)     Time: [id number] PT Time Calculation (min) (ACUTE ONLY): 29 min  Charges:  $Gait Training: 8-22 mins $Therapeutic Activity: 8-22 mins                    G Codes:       Ramond Dial 10/27/2016, 1:37 PM  Mee Hives, PT MS Acute Rehab Dept. Number: [facility]

## 2016-10-18 NOTE — Progress Notes (Signed)
Patient ID: Lance [name], male   DOB: 1955/09/29, 61 y.o.   MRN: [id number] S:No new complaints O:BP (!) 114/56 (BP Location: Left Arm)   Pulse 86   Temp 98.1 F (36.7 C) (Oral)   Resp 18   Ht 5' 10"  (1.778 m)   Wt 122.7 kg (270 lb 6.4 oz)   SpO2 94%   BMI 38.80 kg/m   Intake/Output Summary (Last 24 hours) at 10/18/16 0849 Last data filed at 10/18/16 0700  Gross per 24 hour  Intake            710.7 ml  Output             1950 ml  Net          -1239.3 ml   Intake/Output: I/O last 3 completed shifts: In: 5465 [P.O.:1040; I.V.:226; Other:130] Out: 3650 [Urine:3650]  Intake/Output this shift:  No intake/output data recorded. Weight change: 0.499 kg (1 lb 1.6 oz) KCL:EXNTZ WM, fatigued and weak sitting in a chair CVS:no rub Resp:Decreased BS GYF:VCBSW, +BS, soft Ext:1+ edema   Recent Labs Lab 10/12/16 0500 10/13/16 0456 10/14/16 0530 10/15/16 0454 10/15/16 1330 10/16/16 0542 10/17/16 0430 10/18/16 0410  NA 128* 126* 124* 123* 125* 124* 123* 122*  K 4.4 3.9 4.4 4.4 3.8 4.2 3.9 3.8  CL 86* 85* 78* 75* 78* 75* 75* 76*  CO2 28 28 29 30 29  32 30 29  GLUCOSE 111* 123* 142* 133* 119* 92 95 86  BUN 57* 57* 67* 80* 85* 97* 115* 117*  CREATININE 3.36* 3.32* 3.39* 3.59* 3.50* 3.60* 3.68* 3.84*  ALBUMIN 1.6* 1.4* 1.5* 1.7*  --  1.7* 1.7* 1.7*  CALCIUM 8.2* 8.4* 8.9 8.6* 8.3* 8.3* 7.9* 8.1*  PHOS 5.5* 5.2* 5.6* 6.4*  --  5.8* 5.0* 4.8*   Liver Function Tests:  Recent Labs Lab 10/16/16 0542 10/17/16 0430 10/18/16 0410  ALBUMIN 1.7* 1.7* 1.7*   No results for input(s): LIPASE, AMYLASE in the last 168 hours. No results for input(s): AMMONIA in the last 168 hours. CBC:  Recent Labs Lab 10/14/16 0530 10/15/16 0454 10/16/16 0542 10/17/16 0430 10/18/16 0410  WBC 5.2 4.2 2.6* 3.3* 3.6*  HGB 8.7* 8.1* 7.6* 8.0* 7.7*  HCT 25.8* 24.1* 23.1* 23.5* 22.9*  MCV 89.6 90.6 90.9 89.4 89.8  PLT 66* 52* 40* 38* 35*   Cardiac Enzymes: No results for input(s): CKTOTAL,  CKMB, CKMBINDEX, TROPONINI in the last 168 hours. CBG:  Recent Labs Lab 10/16/16 1133  GLUCAP 105*    Iron Studies: No results for input(s): IRON, TIBC, TRANSFERRIN, FERRITIN in the last 72 hours. Studies/Results: No results found. Marland Kitchen acyclovir  200 mg Oral Daily  . calcium acetate  667 mg Oral TID WC  . [START ON 10/20/2016] dexamethasone  20 mg Intravenous Daily  . docusate sodium  100 mg Oral BID  . mouth rinse  15 mL Mouth Rinse BID  . pantoprazole  40 mg Oral Daily  . polyethylene glycol  17 g Oral Daily  . sodium chloride flush  10-40 mL Intracatheter Q12H  . vitamin B-12  1,000 mcg Oral Daily    BMET    Component Value Date/Time   NA 122 (L) 10/18/2016 0410   K 3.8 10/18/2016 0410   CL 76 (L) 10/18/2016 0410   CO2 29 10/18/2016 0410   GLUCOSE 86 10/18/2016 0410   BUN 117 (H) 10/18/2016 0410   CREATININE 3.84 (H) 10/18/2016 0410   CALCIUM 8.1 (L) 10/18/2016 0410   GFRNONAA  15 (L) 10/18/2016 0410   GFRAA 18 (L) 10/18/2016 0410   CBC    Component Value Date/Time   WBC 3.6 (L) 10/18/2016 0410   RBC 2.55 (L) 10/18/2016 0410   HGB 7.7 (L) 10/18/2016 0410   HCT 22.9 (L) 10/18/2016 0410   PLT 35 (L) 10/18/2016 0410   MCV 89.8 10/18/2016 0410   MCH 30.2 10/18/2016 0410   MCHC 33.6 10/18/2016 0410   RDW 18.9 (H) 10/18/2016 0410   LYMPHSABS 0.7 10/08/2016 0147   MONOABS 0.5 10/08/2016 0147   EOSABS 0.0 10/08/2016 0147   BASOSABS 0.0 10/08/2016 0147     Assessment/Plan:  1. AKI of unclear etiology.  Scr was 1.16 on 09/26/16 and upt to 1.87 on 10/02/16 and now up to 3.5.  DDx includes myeloma or amyloidosis involvement of kidneys, acute GN, cardiorenal syndrome, AIN.   1. No indication for dialysis at this time, however BUN is rising.   2. Agree with holding lasix, however he is up 1 lb 3. He is a poor candidate for HD and would recommend transitioning to comfort measures as he is failing aggressive therapy at this time.   2. Multiple myeloma- per Heme  velcade and  dexamethasone. 3. 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  Currently on dopamine gtt.  Cardiology working up. 1. Was diuresing with lasix and pressors, however developing worsening azotemia 2. Diuretics on hold.   4. Morbid ovesity 5. Pancytopenia- due to #2 6. Disposition- poor overall prognosis and agree with Palliative care consult    Donetta Potts, MD [facility] [phone]

## 2016-10-18 NOTE — Progress Notes (Signed)
Lance [name]   HEMATOLOGY/ONCOLOGY INPATIENT PROGRESS NOTE  Date of Service: 10/17/2016 Inpatient Attending: .Dron Tanna Furry, MD   SUBJECTIVE  Patient notes his breathing continues to improve with diuresis. Received 2nd dose of Velcade today. No tingling and numbness in his hand or feet. No nausea or vomiting. Platelets close to 40k. hgb 8. No overt bleeding.  OBJECTIVE:  NAD  PHYSICAL EXAMINATION: . Vitals:   10/17/16 1941 10/17/16 2336 10/17/16 2344 10/17/16 2345  BP: (!) 102/49 (!) 88/45 (!) 86/48   Pulse: 78  72   Resp: 18  19   Temp:  97.9 F (36.6 C)    TempSrc: Oral Oral    SpO2: 96%   93%  Weight:      Height:       Filed Weights   10/15/16 0504 10/16/16 0416 10/17/16 0445  Weight: 273 lb 4.8 oz (124 kg) 273 lb 8 oz (124.1 kg) 269 lb 4.8 oz (122.2 kg)   .Body mass index is 38.64 kg/m.   GENERAL:alert, not as short of breath at rest SKIN:Significant petechia and ecchymosis on his extremities and right-sided black eye EYES: conjunctival pallor , anicteric sclera , rt periorbital ecchymosis OROPHARYNX: no exudate, no erythema and lips, buccal mucosa, and tongue normal  NECK: JVD+ LYMPH:  no palpable lymphadenopathy in the cervical, axillary or inguinal LUNGS: b/l basal rales HEART:  B/l LE 3+ pedal edema (now in ACE wraps) - decreasing pedal edema ABDOMEN: obese no palpable hepatosplenomegaly PSYCH: alert & oriented x 3 NEURO: no focal motor/sensory deficits  MEDICAL HISTORY:  Past Medical History:  Diagnosis Date  . Depression   . Dilated cardiomyopathy ([place])   . Diverticulosis   . Hypertension   . Iron deficiency anemia   . Multiple myeloma ([place])   . Pancytopenia ([place])     SURGICAL HISTORY: Past Surgical History:  Procedure Laterality Date  . INGUINAL HERNIA REPAIR Right     SOCIAL HISTORY: Social History   Social History  . Marital status: Unknown    Spouse name: N/A  . Number of children: N/A  . Years of education: N/A   Occupational  History  . Not on file.   Social History Main Topics  . Smoking status: Never Smoker  . Smokeless tobacco: Never Used  . Alcohol use No  . Drug use: No  . Sexual activity: Not on file   Other Topics Concern  . Not on file   Social History Narrative  . No narrative on file    FAMILY HISTORY: Family History  Problem Relation Age of Onset  . Hypertension Mother   . Diabetes Mother   . Bone cancer Mother   . Diabetes Father   . Prostate cancer Father   . Lung cancer Sister   . Brain cancer Brother     ALLERGIES:  is allergic to morphine and related; penicillins; and tobacco [nicotiana tabacum].  MEDICATIONS:  Scheduled Meds: . acyclovir  200 mg Oral Daily  . calcium acetate  667 mg Oral TID WC  . cyanocobalamin  1,000 mcg Intramuscular Daily  . [START ON 10/20/2016] dexamethasone  20 mg Intravenous Daily  . docusate sodium  100 mg Oral BID  . mouth rinse  15 mL Mouth Rinse BID  . pantoprazole  40 mg Oral Daily  . polyethylene glycol  17 g Oral Daily  . sodium chloride flush  10-40 mL Intracatheter Q12H   Continuous Infusions: . DOPamine 2.5 mcg/kg/min (10/17/16 0616)   PRN Meds:.acetaminophen **OR** acetaminophen, calcium  carbonate, camphor-menthol **AND** hydrOXYzine, docusate sodium, feeding supplement (NEPRO CARB STEADY), ipratropium-albuterol, ondansetron **OR** ondansetron (ZOFRAN) IV, oxyCODONE, sodium chloride, sodium chloride flush, sorbitol, traZODone, zolpidem  REVIEW OF SYSTEMS:    10 Point review of Systems was done is negative except as noted above.   LABORATORY DATA:  I have reviewed the data as listed  . CBC Latest Ref Rng & Units 10/17/2016 10/16/2016 10/15/2016  WBC 4.0 - 10.5 K/uL 3.3(L) 2.6(L) 4.2  Hemoglobin 13.0 - 17.0 g/dL 8.0(L) 7.6(L) 8.1(L)  Hematocrit 39.0 - 52.0 % 23.5(L) 23.1(L) 24.1(L)  Platelets 150 - 400 K/uL 38(L) 40(L) 52(L)    . CMP Latest Ref Rng & Units 10/17/2016 10/16/2016 10/15/2016  Glucose 65 - 99 mg/dL 95 92 119(H)  BUN  6 - 20 mg/dL 115(H) 97(H) 85(H)  Creatinine 0.61 - 1.24 mg/dL 3.68(H) 3.60(H) 3.50(H)  Sodium 135 - 145 mmol/L 123(L) 124(L) 125(L)  Potassium 3.5 - 5.1 mmol/L 3.9 4.2 3.8  Chloride 101 - 111 mmol/L 75(L) 75(L) 78(L)  CO2 22 - 32 mmol/L 30 32 29  Calcium 8.9 - 10.3 mg/dL 7.9(L) 8.3(L) 8.3(L)  Total Protein 6.5 - 8.1 g/dL - - -  Total Bilirubin 0.3 - 1.2 mg/dL - - -  Alkaline Phos 38 - 126 U/L - - -  AST 15 - 41 U/L - - -  ALT 17 - 63 U/L - - -       Component     Latest Ref Rng & Units 10/11/2016  Prothrombin Time     11.4 - 15.2 seconds 22.6 (H)  INR      1.96  APTT     24 - 36 seconds 73 (H)  Fibrinogen     210 - 475 mg/dL 237  Factor X Activity     76 - 183 % 52 (L)   RADIOGRAPHIC STUDIES: I have personally reviewed the radiological images as listed and agreed with the findings in the report. Dg Lumbar Spine 1 View  Result Date: 10/15/2016 CLINICAL DATA:  61 year old male with back pain and a history of transfusion dependent multiple myeloma. Admitted to [facility] earlier this month, transferred for nephrology consultation. EXAM: LUMBAR SPINE - 1 VIEW COMPARISON:  [facility] CT Abdomen and Pelvis 10/05/2016. [facility] radiographic bone survey 07/15/2014. FINDINGS: Standing AP view of the lumbar spine. Normal lumbar segmentation. This single view is most easily compared to the 2015 bone survey AP view of the lumbar spine. Chronic mild L1 and L2 compression appears stable since that time. L3 through L5 vertebral body height appears stable. Visible lower thoracic levels appear grossly stable, see also thoracic spine series from today reported separately. IMPRESSION: Stable AP radiographic appearance of lumbar spine since 2015. Electronically Signed   By: Genevie Ann M.D.   On: 10/15/2016 14:09   Dg Thoracic Spine 1 View  Result Date: 10/15/2016 CLINICAL DATA:  61 year old male with back pain and a history of transfusion dependent multiple myeloma. Admitted to  [facility] earlier this month, transferred for nephrology consultation. EXAM: THORACIC SPINE 1 VIEW COMPARISON:  [facility] portable chest radiograph 08/27/2016 and radiographic skeletal survey 07/15/2014. Standing AP view of the lumbar spine today reported separately. FINDINGS: Normal thoracic segmentation. AP thoracic vertebral height and alignment appears stable since 2015. Bone mineralization appears stable. Streaky left lung base opacity and small left pleural effusion are evident. Partially visible right PICC line or central line. IMPRESSION: 1. Stable AP radiographic appearance of the thoracic spine since 2015. 2. Partially visible  left pleural effusion and streaky left lung base opacity. Electronically Signed   By: Genevie Ann M.D.   On: 10/15/2016 14:12   US Renal  Result Date: 10/07/2016 CLINICAL DATA:  Acute renal failure EXAM: RENAL / URINARY TRACT ULTRASOUND COMPLETE COMPARISON:  None. FINDINGS: Right Kidney: Length: 12.2 cm. Echogenicity within normal limits. No mass or hydronephrosis visualized. Left Kidney: Length: 12.7 cm. Echogenicity within normal limits. No mass or hydronephrosis visualized. Bladder: The bladder is decompressed with a Foley catheter. Ascites is seen in the abdomen. IMPRESSION: 1. Limited views of the kidneys due to patient body habitus. No acute abnormality. 2. Ascites. Electronically Signed   By: Dorise Bullion III M.D   On: 10/07/2016 21:29   Portable Chest 1 View  Result Date: 10/06/2016 CLINICAL DATA:  Chest pain, history of pneumonia and wheezing. EXAM: PORTABLE CHEST 1 VIEW COMPARISON:  10/04/2016 FINDINGS: There is stable cardiomegaly. No aortic aneurysm. Mild vascular congestion consistent with CHF. Small bilateral pleural effusions with trace fluid in the right minor fissure. Subsegmental atelectasis in the left mid lung versus scarring. Bibasilar densities cannot exclude superimposed pneumonia. No acute osseous abnormality. IMPRESSION: Stable  cardiomegaly with mild CHF. Superimposed bibasilar pneumonia would be difficult to entirely exclude. Trace bilateral pleural effusions. Electronically Signed   By: Ashley Royalty M.D.   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 Certainly possible concurrently with his obvious diagnosis of multiple myeloma. Cannot be definitively proven without a fat pad biopsy or endomyocardial biopsy -- which would not be recommended since it would not change treatment options at this time.  3) Acute renal failure - likely due to his multiple myeloma. 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 Could also could be myeloma kidney. creatinine relatively stable.  4) Pancytopenia   due to multiple myeloma. Patient is currently transfusion dependent and has needed about 10 units of PRBCs in the last 4-6 weeks.part of his anemia is also due to blood loss from intermittent rectal bleeding and hematuria .  also has thrombocytopenia - platelet down to 38k -- no overt bleeding reported by patient. Pancytopenia will not improve without treatment of his multiple myeloma. Hgb relatively stable in the mid 8-9 range.  5) Coagulopathy. Patient has elevated PT and PTT. The setting of multiple myeloma this could be due to his paraproteinemia affecting febrile  polymerization. Noted to have have some deficiency in FX as is seen with amyloidosis  6) Significant Hyponatremia - multifactorial -- fluid overload from CHF and renal failure. Also element of pseudohyponatremia from his paraproteinemia.  Plan -rpt [facility] tomorrow AM -Diuresis and cardiac optimization as per nephrology and cardiology -PRBC transfusion for HGB<8 or if bleeding -PLT transfusion for PLT<20K or if bleeding (considering associated coagulopathy) -  FFP if uncontrolled bleeding and fluid status allows. -patient had received weekly Velcade by Dr Vista Lawman for 2-3 weeks. Did not take his dexamethasone or Revlimid. Not a good candidate for Revlimid currently due to renal function. -will plan to do Velcade D1,4,8 and 11 (next dose 2/23 if blood counts stable) with weekly Dexamethason (next dose 2/23). -will need to consider adding Cytoxan and go to CyBor[facility] for next cycle If counts stable. -continue Prophylactic Acyclovir -will need to transition cares to Dr Bobby Rumpf for oncologic cares on discharge will treat while in the hospital.    Sullivan Lone MD [place] AAHIVMS [facility] Hematology/Oncology Physician [place]  (Office):       [phone] (Work cell):  [phone] (Fax):           [phone]  10/18/2016 12:05 AM

## 2016-10-18 NOTE — Progress Notes (Signed)
PROGRESS NOTE        PATIENT DETAILS Name: Lance [name] Age: 61 y.o. Sex: male Date of Birth: 1956/07/12 Admit Date: 10/06/2016 Admitting Physician Verlee Monte, MD PCP:No primary care provider on file.  Brief Narrative: Patient is a 61 y.o. male with history of transfusion dependent multiple myeloma, chronic systolic heart failure was initially admitted to [facility] on 2/7 for decompensated systolic heart failure. His renal function continued to worsen, as a result he was transferred to [facility] for evaluation. He was subsequently placed on high-dose diuretics with only some mild improvement, subsequently oncology was consulted to see if treatment of underlying myeloma while inpatient would improve some of his symptoms. He subsequently began inpatient chemotherapy. See below for further details  Subjective: Remains very frail and weak. Sitting up in chair.  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 Although his volume status has improved compared to his initial presentation ( -25 L), he still appears volume overloaded and has worsening hyponatremia. Diuretics currently on hold due to worsening renal function, he remains on low-dose dopamine. His overall prognosis is very poor, and if no significant improvement in the next few days, may need to consider transition to hospice care. Cardiology and palliative care following.  Acute on chronic kidney injury stage III: ARF probably multifactorial in a setting of multiple myeloma  and probably some element of cardiorenal syndrome. Diuretics on hold, nephrology following. Follow urine output, await further recommendations from nephrology.  Acute hypoxic respiratory failure: Secondary to decompensated CHF, will continue to slowly titrate down oxygen as tolerated.  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 Monitor CBC-we will transfuse if any further drop in hemoglobin or if platelet count is less than 20,000.  Hyponatremia: Secondary to hyperkalemia in a setting of systolic heart failure and renal failure. Continue to follow electrolytes.  Goals of care/palliative care: Patient aware of poor overall prognoses-and continued slow deterioration in spite of aggressive care. DO NOT RESUSCITATE in place. Palliative care following, suspect that if no further clinical improvement over the next few days-may need to transition to full comfort measures. Will await further recommendations from the palliative care team.  DVT Prophylaxis: Not on pharmacological prophylaxis given severe thrombocytopenia-has bilateral lower extremity compression wraps.  Code Status:  DNR  Family Communication: None at bedside  Disposition Plan: Remain inpatient-requires several more days of hospitalization.  Antimicrobial agents: Anti-infectives    Start     Dose/Rate Route Frequency Ordered Stop   10/12/16 1000  acyclovir (ZOVIRAX) 200 MG capsule 200 mg     200 mg Oral Daily 10/12/16 0739        Procedures: Echo 2/10>>EF 35%-diffuse hypokinesis. RV systolic function severely reduced.  CONSULTS:  cardiology, nephrology and Palliative care  Time spent: 25- minutes-Greater than 50% of this time was spent in counseling, explanation of diagnosis, planning of further management, and coordination of care.  MEDICATIONS: Scheduled Meds: . acyclovir  200 mg Oral Daily  . calcium acetate  667 mg Oral TID WC  . [START ON 10/20/2016] dexamethasone  20 mg Intravenous Daily  . docusate sodium  100 mg Oral BID  . mouth rinse  15 mL Mouth Rinse BID  . pantoprazole  40 mg Oral Daily  . polyethylene glycol  17 g Oral Daily  . sodium chloride flush  10-40 mL Intracatheter Q12H  . vitamin B-12   1,000 mcg Oral Daily   Continuous Infusions: . DOPamine 2.5 mcg/kg/min (10/17/16 0616)   PRN Meds:.acetaminophen **OR** acetaminophen, calcium carbonate, camphor-menthol **AND** hydrOXYzine, docusate sodium, feeding supplement (NEPRO CARB STEADY), ipratropium-albuterol, ondansetron **OR** ondansetron (ZOFRAN) IV, oxyCODONE, sodium chloride, sodium chloride flush, sorbitol, traZODone, zolpidem   PHYSICAL EXAM: Vital signs: Vitals:   10/17/16 2344 10/17/16 2345 10/18/16 0359 10/18/16 0420  BP: (!) 86/48  (!) 114/56   Pulse: 72  86   Resp: 19  18   Temp:   98.1 F (36.7 C)   TempSrc:   Oral   SpO2:  93% 94%   Weight:    122.7 kg (270 lb 6.4 oz)  Height:       Filed Weights   10/16/16 0416 10/17/16 0445 10/18/16 0420  Weight: 124.1 kg (273 lb 8 oz) 122.2 kg (269 lb 4.8 oz) 122.7 kg (270 lb 6.4 oz)   Body mass index is 38.8 kg/m.   General appearance :Awake, alert, not in any distress. Frail and chronically sick appearing Eyes:, pupils equally reactive to light and accomodation,no scleral icterus. HEENT: Atraumatic and Normocephalic Neck: supple Resp:Good air entry bilaterally, no added sounds  CVS: S1 S2 regular, no murmurs.  GI: Bowel sounds present, Non tender and not distended with no gaurding, rigidity or rebound. Extremities: B/L Lower Ext with bilateral compression wraps-but still appears to have 1-2+ edema Neurology:  speech clear,Non focal-but with generalized weakness Psychiatric: Normal judgment and insight. Alert and oriented x 3.  Musculoskeletal:No digital cyanosis Skin:No Rash, warm and dry Wounds:N/A  I have personally reviewed following labs and imaging studies  LABORATORY DATA: CBC:  Recent Labs Lab 10/14/16 0530 10/15/16 0454 10/16/16 0542 10/17/16 0430 10/18/16 0410  WBC 5.2 4.2 2.6* 3.3* 3.6*  HGB 8.7* 8.1* 7.6* 8.0* 7.7*  HCT 25.8* 24.1* 23.1* 23.5* 22.9*  MCV 89.6 90.6 90.9 89.4 89.8  PLT 66* 52* 40* 38* 35*    Basic Metabolic  Panel:  Recent Labs Lab 10/14/16 0530 10/15/16 0454 10/15/16 1330 10/16/16 0542 10/17/16 0430 10/18/16 0410  NA 124* 123* 125* 124* 123* 122*  K 4.4 4.4 3.8 4.2 3.9 3.8  CL 78* 75* 78* 75* 75* 76*  CO2 29 30 29  32 30 29  GLUCOSE 142* 133* 119* 92 95 86  BUN 67* 80* 85* 97* 115* 117*  CREATININE 3.39* 3.59* 3.50* 3.60* 3.68* 3.84*  CALCIUM 8.9 8.6* 8.3* 8.3* 7.9* 8.1*  PHOS 5.6* 6.4*  --  5.8* 5.0* 4.8*    GFR: Estimated Creatinine Clearance: 26.2 mL/min (by C-G formula based on SCr of 3.84 mg/dL (H)).  Liver Function Tests:  Recent Labs Lab 10/14/16 0530 10/15/16 0454 10/16/16 0542 10/17/16 0430 10/18/16 0410  ALBUMIN 1.5* 1.7* 1.7* 1.7* 1.7*   No results for input(s): LIPASE, AMYLASE in the last 168 hours. No results for input(s): AMMONIA in the last 168 hours.  Coagulation Profile:  Recent Labs Lab 10/11/16 1505  INR 1.96    Cardiac Enzymes: No results for input(s): CKTOTAL, CKMB, CKMBINDEX, TROPONINI in the last 168 hours.  BNP (last 3 results) No results for input(s): PROBNP in the last 8760 hours.  HbA1C: No results for input(s): HGBA1C in the last 72 hours.  CBG:  Recent Labs Lab 10/16/16 1133  GLUCAP 105*    Lipid Profile: No results for input(s): CHOL, HDL,  LDLCALC, TRIG, CHOLHDL, LDLDIRECT in the last 72 hours.  Thyroid Function Tests: No results for input(s): TSH, T4TOTAL, FREET4, T3FREE, THYROIDAB in the last 72 hours.  Anemia Panel: No results for input(s): VITAMINB12, FOLATE, FERRITIN, TIBC, IRON, RETICCTPCT in the last 72 hours.  Urine analysis:    Component Value Date/Time   COLORURINE RED (A) 10/06/2016 2210   APPEARANCEUR CLOUDY (A) 10/06/2016 2210   LABSPEC 1.012 10/06/2016 2210   PHURINE 6.0 10/06/2016 2210   GLUCOSEU 50 (A) 10/06/2016 2210   HGBUR MODERATE (A) 10/06/2016 2210   BILIRUBINUR NEGATIVE 10/06/2016 2210   KETONESUR NEGATIVE 10/06/2016 2210   PROTEINUR 100 (A) 10/06/2016 2210   NITRITE NEGATIVE  10/06/2016 2210   LEUKOCYTESUR NEGATIVE 10/06/2016 2210    Sepsis Labs: Lactic Acid, Venous No results found for: LATICACIDVEN  MICROBIOLOGY: Recent Results (from the past 240 hour(s))  MRSA PCR Screening     Status: None   Collection Time: 10/09/16  1:26 PM  Result Value Ref Range Status   MRSA by PCR NEGATIVE NEGATIVE Final    Comment:        The GeneXpert MRSA Assay (FDA approved for NASAL specimens only), is one component of a comprehensive MRSA colonization surveillance program. It is not intended to diagnose MRSA infection nor to guide or monitor treatment for MRSA infections.     RADIOLOGY STUDIES/RESULTS: Dg Lumbar Spine 1 View  Result Date: 10/15/2016 CLINICAL DATA:  61 year old male with back pain and a history of transfusion dependent multiple myeloma. Admitted to [facility] earlier this month, transferred for nephrology consultation. EXAM: LUMBAR SPINE - 1 VIEW COMPARISON:  [facility] CT Abdomen and Pelvis 10/05/2016. [facility] radiographic bone survey 07/15/2014. FINDINGS: Standing AP view of the lumbar spine. Normal lumbar segmentation. This single view is most easily compared to the 2015 bone survey AP view of the lumbar spine. Chronic mild L1 and L2 compression appears stable since that time. L3 through L5 vertebral body height appears stable. Visible lower thoracic levels appear grossly stable, see also thoracic spine series from today reported separately. IMPRESSION: Stable AP radiographic appearance of lumbar spine since 2015. Electronically Signed   By: Genevie Ann M.D.   On: 10/15/2016 14:09   Dg Thoracic Spine 1 View  Result Date: 10/15/2016 CLINICAL DATA:  61 year old male with back pain and a history of transfusion dependent multiple myeloma. Admitted to [facility] earlier this month, transferred for nephrology consultation. EXAM: THORACIC SPINE 1 VIEW COMPARISON:  [facility] portable chest radiograph 08/27/2016 and  radiographic skeletal survey 07/15/2014. Standing AP view of the lumbar spine today reported separately. FINDINGS: Normal thoracic segmentation. AP thoracic vertebral height and alignment appears stable since 2015. Bone mineralization appears stable. Streaky left lung base opacity and small left pleural effusion are evident. Partially visible right PICC line or central line. IMPRESSION: 1. Stable AP radiographic appearance of the thoracic spine since 2015. 2. Partially visible left pleural effusion and streaky left lung base opacity. Electronically Signed   By: Genevie Ann M.D.   On: 10/15/2016 14:12   US Renal  Result Date: 10/07/2016 CLINICAL DATA:  Acute renal failure EXAM: RENAL / URINARY TRACT ULTRASOUND COMPLETE COMPARISON:  None. FINDINGS: Right Kidney: Length: 12.2 cm. Echogenicity within normal limits. No mass or hydronephrosis visualized. Left Kidney: Length: 12.7 cm. Echogenicity within normal limits. No mass or hydronephrosis visualized. Bladder: The bladder is decompressed with a Foley catheter. Ascites is seen in the abdomen. IMPRESSION: 1. Limited views of the kidneys due to patient  body habitus. No acute abnormality. 2. Ascites. Electronically Signed   By: Dorise Bullion III M.D   On: 10/07/2016 21:29   Portable Chest 1 View  Result Date: 10/06/2016 CLINICAL DATA:  Chest pain, history of pneumonia and wheezing. EXAM: PORTABLE CHEST 1 VIEW COMPARISON:  10/04/2016 FINDINGS: There is stable cardiomegaly. No aortic aneurysm. Mild vascular congestion consistent with CHF. Small bilateral pleural effusions with trace fluid in the right minor fissure. Subsegmental atelectasis in the left mid lung versus scarring. Bibasilar densities cannot exclude superimposed pneumonia. No acute osseous abnormality. IMPRESSION: Stable cardiomegaly with mild CHF. Superimposed bibasilar pneumonia would be difficult to entirely exclude. Trace bilateral pleural effusions. Electronically Signed   By: Ashley Royalty M.D.   On:  10/06/2016 22:09     LOS: 12 days   Oren Binet, MD  [facility] Pager:[phone]  If 7PM-7AM, please contact night-coverage [email or web address] Password [facility] 10/18/2016, 10:19 AM

## 2016-10-18 NOTE — Progress Notes (Signed)
RN updated Dr. Eula Fried on call with Cardiology pertaining to patient's recent blood pressure manually was 86/48, calculated MAP 61.  Patient asymptomatic.  No new orders at this time, will continue to monitor.

## 2016-10-18 NOTE — Progress Notes (Signed)
Advanced Heart Failure Rounding Note   Subjective:   61 y/o Lance [name] with apparent longstanding multiple myeloma (per Dr. 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   2/12 started dopamine.  Lasix off. Remains on low-dose dopamine. Weight up a pound.  Feels ok. Tired. SBP in the 80s last night. Asymptomatic. Creatinine slightly worse. BUN 117  Getting treatment for multiple myeloma with Velcade and dexamethasone. Worsening pancytopenia with some nosebleeds.     Objective:    Vital Signs:   Temp:  [97.5 F (36.4 C)-98.1 F (36.7 C)] 98.1 F (36.7 C) (02/21 0359) Pulse Rate:  [72-86] 86 (02/21 0359) Resp:  [18-19] 18 (02/21 0359) BP: (86-155)/(45-56) 114/56 (02/21 0359) SpO2:  [92 %-96 %] 94 % (02/21 0359) Weight:  [122.7 kg (270 lb 6.4 oz)] 122.7 kg (270 lb 6.4 oz) (02/21 0420) Last BM Date: 10/17/16  Weight change: Filed Weights   10/16/16 0416 10/17/16 0445 10/18/16 0420  Weight: 124.1 kg (273 lb 8 oz) 122.2 kg (269 lb 4.8 oz) 122.7 kg (270 lb 6.4 oz)    Intake/Output:   Intake/Output Summary (Last 24 hours) at 10/18/16 0605 Last data filed at 10/18/16 0429  Gross per 24 hour  Intake           825.61 ml  Output             2150 ml  Net         -1324.39 ml     Physical Exam: General: Seated in chair. Fatigued appearing.   HEENT: normal, R periorbital ecchymosis. Some epistaxis Neck: supple. JVP to jaw Carotids 2+ bilat; no bruits. No thyromegaly or nodule noted.  Cor: PMI nondisplaced. RRR. No rubs, gallops 2/6 TR Lungs: Diminished throughout, on RA.   Abdomen: obese, soft, NT, ND, no HSM. No bruits or masses. +BS  Extremities: no cyanosis, clubbing, rash, R and LLE 1+ edema Neuro: alert & orientedx3, cranial nerves grossly intact. moves all 4 extremities w/o difficulty. Affect flat.   Telemetry:  Reviewed, NSR 80-90s    Labs: Basic Metabolic Panel:  Recent Labs Lab 10/14/16 0530 10/15/16 0454 10/15/16 1330 10/16/16 0542 10/17/16 0430 10/18/16 0410  NA 124* 123* 125* 124* 123* 122*  K 4.4 4.4 3.8 4.2 3.9 3.8  CL 78* 75* 78* 75* 75* 76*  CO2 _0 32 30 29  GLUCOSE 142* 133* 119* 92 95 86  BUN 67* 80* 85* 97* 115* 117*  CREATININE 3.39* 3.59* 3.50* 3.60* 3.68* 3.84*  CALCIUM 8.9 8.6* 8.3* 8.3* 7.9* 8.1*  PHOS 5.6* 6.4*  --  5.8* 5.0* 4.8*    Liver Function Tests:  Recent Labs Lab 10/14/16 0530 10/15/16 0454 10/16/16 0542 10/17/16 0430 10/18/16 0410  ALBUMIN 1.5* 1.7* 1.7* 1.7* 1.7*    CBC:  Recent Labs Lab 10/14/16 0530 10/15/16 0454 10/16/16 0542 10/17/16 0430 10/18/16 0410  WBC 5.2 4.2 2.6* 3.3* 3.6*  HGB 8.7* 8.1* 7.6* 8.0* 7.7*  HCT 25.8* 24.1* 23.1* 23.5* 22.9*  MCV 89.6 90.6 90.9 89.4 89.8  PLT 66* 52* 40* 38* 35*    Cardiac Enzymes: No results for input(s): CKTOTAL, CKMB, CKMBINDEX, TROPONINI in the last 168 hours.  BNP: BNP (last 3 results)  Recent Labs  10/06/16 2101  BNP 1,535.9*    Medications:     Scheduled Medications: . acyclovir  200 mg  Oral Daily  . calcium acetate  667 mg Oral TID WC  . [START ON 10/20/2016] dexamethasone  20 mg Intravenous Daily  . docusate sodium  100 mg Oral BID  . mouth rinse  15 mL Mouth Rinse BID  . pantoprazole  40 mg Oral Daily  . polyethylene glycol  17 g Oral Daily  . sodium chloride flush  10-40 mL Intracatheter Q12H  . vitamin B-12  1,000 mcg Oral Daily    Infusions: . DOPamine 2.5 mcg/kg/min (10/17/16 0616)    PRN Medications: acetaminophen **OR** acetaminophen, calcium carbonate, camphor-menthol **AND** hydrOXYzine, docusate sodium, feeding supplement (NEPRO CARB STEADY), ipratropium-albuterol, ondansetron **OR** ondansetron (ZOFRAN) IV, oxyCODONE, sodium chloride, sodium chloride flush, sorbitol, traZODone, zolpidem   Assessment:   1. Acute Biventricular Heart Failure 2.  Acute on chronic Renal Failure 3. Anemia 4. Multiple Myeloma 5. Hyponatremia 6. Pancytopenia  7. Epistaxis   Plan/Discussion:    Lasix off. Remains on low-dose dopamine. BP low. BUN/Creatine slightly worse. Continue to hold diuretics. Will continue dopamine until renal function and BP stabilize. Sodium 122-124 range.  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 I am concerned that we will not be able to manage volume status without IV lasix or dopamine. Prognosis remains very concerning.   Would consider removing Foley particularly in light of pancytopenia.   Length of Stay: 12  Glori Bickers, MD  10/18/2016, 6:05 AM  Advanced Heart Failure Team Please contact [facility] Cardiology for night-coverage after hours (4p -7a ) and weekends on [email or web address]

## 2016-10-19 DIAGNOSIS — E871 Hypo-osmolality and hyponatremia: Secondary | ICD-10-CM

## 2016-10-19 LAB — CBC
HEMATOCRIT: 21.2 % — AB (ref 39.0–52.0)
HEMOGLOBIN: 7.1 g/dL — AB (ref 13.0–17.0)
MCH: 30 pg (ref 26.0–34.0)
MCHC: 33.5 g/dL (ref 30.0–36.0)
MCV: 89.5 fL (ref 78.0–100.0)
Platelets: 35 10*3/uL — ABNORMAL LOW (ref 150–400)
RBC: 2.37 MIL/uL — AB (ref 4.22–5.81)
RDW: 18.9 % — AB (ref 11.5–15.5)
WBC: 3.3 10*3/uL — AB (ref 4.0–10.5)

## 2016-10-19 LAB — PHOSPHORUS: PHOSPHORUS: 4.5 mg/dL (ref 2.5–4.6)

## 2016-10-19 LAB — COMPREHENSIVE METABOLIC PANEL
ALBUMIN: 1.6 g/dL — AB (ref 3.5–5.0)
ALT: 26 U/L (ref 17–63)
ANION GAP: 16 — AB (ref 5–15)
AST: 25 U/L (ref 15–41)
Alkaline Phosphatase: 85 U/L (ref 38–126)
BUN: 123 mg/dL — ABNORMAL HIGH (ref 6–20)
CALCIUM: 8.2 mg/dL — AB (ref 8.9–10.3)
CHLORIDE: 80 mmol/L — AB (ref 101–111)
CO2: 27 mmol/L (ref 22–32)
Creatinine, Ser: 3.9 mg/dL — ABNORMAL HIGH (ref 0.61–1.24)
GFR calc Af Amer: 18 mL/min — ABNORMAL LOW (ref >60)
GFR calc non Af Amer: 15 mL/min — ABNORMAL LOW (ref >60)
Glucose, Bld: 95 mg/dL (ref 65–99)
POTASSIUM: 3.9 mmol/L (ref 3.5–5.1)
SODIUM: 123 mmol/L — AB (ref 135–145)
Total Bilirubin: 2.1 mg/dL — ABNORMAL HIGH (ref 0.3–1.2)
Total Protein: 11.5 g/dL — ABNORMAL HIGH (ref 6.5–8.1)

## 2016-10-19 LAB — KAPPA/LAMBDA LIGHT CHAINS
KAPPA FREE LGHT CHN: 3942.4 mg/L — AB (ref 3.3–19.4)
KAPPA, LAMDA LIGHT CHAIN RATIO: 1314.13 — AB (ref 0.26–1.65)
LAMDA FREE LIGHT CHAINS: 3 mg/L — AB (ref 5.7–26.3)

## 2016-10-19 LAB — PREPARE RBC (CROSSMATCH)

## 2016-10-19 LAB — MAGNESIUM: Magnesium: 2.3 mg/dL (ref 1.7–2.4)

## 2016-10-19 MED ORDER — ACETAMINOPHEN 325 MG PO TABS
650.0000 mg | ORAL_TABLET | Freq: Once | ORAL | Status: AC
  Administered 2016-10-19: 650 mg via ORAL
  Filled 2016-10-19: qty 2

## 2016-10-19 MED ORDER — SODIUM CHLORIDE 0.9 % IV SOLN
Freq: Once | INTRAVENOUS | Status: DC
Start: 2016-10-19 — End: 2016-10-20

## 2016-10-19 MED ORDER — DIPHENHYDRAMINE HCL 50 MG/ML IJ SOLN
25.0000 mg | Freq: Once | INTRAMUSCULAR | Status: AC
  Administered 2016-10-19: 25 mg via INTRAVENOUS
  Filled 2016-10-19: qty 1

## 2016-10-19 NOTE — Progress Notes (Addendum)
Addendum:  I met again with Dellis Filbert and Sharyn Lull in person this afternoon.  After a lengthy conversation with me, Sharyn Lull spoke with Dr. Melvia Heaps of nephrology at length as well.  After their conversation Sharyn Lull seemed more understanding of Cordero's condition.  Her hope is to be able to take him home to her house and care for him there.   Home health services were offered and Hospice services were offered - but thus far, Sharyn Lull refuses all of these.  We will continue to work with Abby Potash, and Coralyn Mark (brother) as long as Mr. Schoenherr remains in the hospital.  My best recommendation for him is to receive the benefits of hospice house, but I will support the wishes of the [place] family.                                                                                                                                                                                                          Daily Progress Note   Patient Name: Lance [name]       Date: 10/19/2016 DOB: 1956-06-11  Age: 61 y.o. MRN#: [id number] Attending Physician: Jonetta Osgood, MD Primary Care Physician: No primary care provider on file. Admit Date: 10/06/2016  Reason for Consultation/Follow-up: Establishing goals of care  Subjective: Met with this very pleasant patient at bedside.  In his words - I have bone cancer that is now causing my heart and kidneys to fail.  I don't have very much longer.    I asked what he is hopeful for - He hopes that his brother and sister can accept his passing.    He explains that the family has been thru numerous losses over the last 5 years including a child, and that his siblings likely can not tolerate another loss.  I spoke with his sister Sharyn Lull on the phone.  Sharyn Lull is angry, frustrated and striking out.  She is tired of being "talked down to".  She believes that her brother is in a camera room so that the MDs can watch the tapes and if she and her brother are feeling too happy or  things are going well an MD will purposely come in to the room to tell Admiral that he is dying and knock the wind out of him.   She is very angry and discouraged with the treatment she and Missael have received here.  I then spoke with the patient's brother Coralyn Mark and invited him to have a family conversation.  Coralyn Mark will get back to me with a time that he  is available.  Patient Profile/HPI: 61 y.o. male  with past medical history of transfusion dependent multiple myeloma, sys HF (EF 35-40%), pancytopenia, hypertension admitted on 10/06/2016 with for CHF exacerbation. He was transferred to [facility] from [facility] for nephrology consult d/t AKI in setting of sever CHF and volume overload not responding to high-dose diuretics.  Palliative medicine consulted for [place].   Length of Stay: 13  Current Medications: Scheduled Meds:  . acyclovir  200 mg Oral Daily  . [START ON 10/20/2016] bortezomib SQ  1.3 mg/m2 Subcutaneous Once  . calcium acetate  667 mg Oral TID WC  . [START ON 10/20/2016] dexamethasone  20 mg Intravenous Daily  . docusate sodium  100 mg Oral BID  . mouth rinse  15 mL Mouth Rinse BID  . pantoprazole  40 mg Oral Daily  . polyethylene glycol  17 g Oral Daily  . sodium chloride flush  10-40 mL Intracatheter Q12H  . vitamin B-12  1,000 mcg Oral Daily    Continuous Infusions: . DOPamine 2.5 mcg/kg/min (10/18/16 1918)    PRN Meds: acetaminophen **OR** acetaminophen, calcium carbonate, camphor-menthol **AND** hydrOXYzine, docusate sodium, feeding supplement (NEPRO CARB STEADY), ipratropium-albuterol, ondansetron **OR** ondansetron (ZOFRAN) IV, ondansetron (ZOFRAN) IV, oxyCODONE, sodium chloride, sodium chloride flush, sorbitol, traZODone, zolpidem  Physical Exam       Obese very pleasant man.   Sitting on the side of the bed having just thrown up his breakfast. His mouth shows evidence of dried blood He is alert, orientated, and clear thinking.   Vital Signs: BP 131/63 (BP  Location: Left Arm)   Pulse 80   Temp 98.8 F (37.1 C) (Oral)   Resp 12   Ht _0  (1.778 m)   Wt 123.6 kg (272 lb 8 oz)   SpO2 95%   BMI 39.10 kg/m  SpO2: SpO2: 95 % O2 Device: O2 Device: Not Delivered O2 Flow Rate: O2 Flow Rate (L/min): 2 L/min  Intake/output summary:   Intake/Output Summary (Last 24 hours) at 10/19/16 1057 Last data filed at 10/19/16 0900  Gross per 24 hour  Intake           1260.4 ml  Output             1550 ml  Net           -289.6 ml   LBM: Last BM Date: 10/17/16 Baseline Weight: Weight: (!) 143.6 kg (316 lb 9.3 oz) Most recent weight: Weight: 123.6 kg (272 lb 8 oz)       Palliative Assessment/Data:    Flowsheet Rows   Flowsheet Row Most Recent Value  Intake Tab  Referral Department  Hospitalist  Unit at Time of Referral  Cardiac/Telemetry Unit  Palliative Care Primary Diagnosis  Cancer  Date Notified  10/09/16  Palliative Care Type  New Palliative care  Reason for referral  Clarify Goals of Care  Date of Admission  10/06/16  # of days IP prior to Palliative referral  3  Clinical Assessment  Palliative Performance Scale Score  40%  Nausea Max Last 24 Hours  7  Nausea Min Last 24 Hours  3  Psychosocial & Spiritual Assessment  Palliative Care Outcomes  Patient/Family meeting held?  Yes  Who was at the meeting?  patient and sister  Palliative Care Outcomes  Clarified goals of care      Patient Active Problem List   Diagnosis Date Noted  . Hyponatremia   . Thrombocytopenia ([place])   . Bleeding diathesis ([place])   .  Advance care planning   . Goals of care, counseling/discussion   . Palliative care by specialist   . 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 Acute respiratory failure with hypoxia ([place]) 10/07/2016  . Hyperkalemia 10/07/2016  . Hyperphosphatemia 10/07/2016  . Hypocalcemia 10/07/2016  . Antineoplastic chemotherapy induced pancytopenia ([place]) 10/07/2016  . Multiple myeloma not having achieved remission ([place]) 10/07/2016  . Chest pain     . Acute on chronic combined systolic and diastolic congestive heart failure ([place]) 10/06/2016  . ARF (acute renal failure) ([place]) 10/06/2016  . HTN (hypertension) 10/06/2016  . Anasarca 10/06/2016    Palliative Care Plan    Recommendations/Plan:  PMT scheduling family meeting.  However, if he continues to decline as he has I would recommend Hospice House on discharge.  I feel it is unlikely his sister will accept his fate with peace.   Goals of Care and Additional Recommendations:  Limitations on Scope of Treatment: Full Scope Treatment  Code Status:  DNR  Prognosis:   < 2 weeks - given advanced multiple myeloma with pancytopenia, renal failure, and heart failure requiring dopamine.   Discharge Planning:  To Be Determined, [facility] house vs [place].  Care plan was discussed with  Patient, Sister, and [facility] MD. - Greatly appreciate the support of Dr. Melvia Heaps.  Thank you for allowing the Palliative Medicine Team to assist in the care of this patient.  Total time spent:  60 min.     Greater than 50%  of this time was spent counseling and coordinating care related to the above assessment and plan.  Imogene Burn, PA-C Palliative Medicine  Please contact Palliative MedicineTeam phone at [phone] for questions and concerns between 7 am - 7 pm.   Please see AMION for individual provider pager numbers.

## 2016-10-19 NOTE — Progress Notes (Signed)
Patient ID: Lisandro Meggett, male   DOB: June 28, 1956, 61 y.o.   MRN: [id number] S:Has been having some N/V this am O:BP (!) 113/55 (BP Location: Left Arm)   Pulse 86   Temp 97.8 F (36.6 C) (Oral)   Resp (!) 23   Ht _0  (1.778 m)   Wt 123.6 kg (272 lb 8 oz)   SpO2 95%   BMI 39.10 kg/m   Intake/Output Summary (Last 24 hours) at 10/19/16 0811 Last data filed at 10/19/16 0700  Gross per 24 hour  Intake             1160 ml  Output             1550 ml  Net             -390 ml   Intake/Output: I/O last 3 completed shifts: In: 1570.4 [P.O.:1360; I.V.:80.4; Other:130] Out: 2700 [Urine:2700]  Intake/Output this shift:  No intake/output data recorded. Weight change: 0.953 kg (2 lb 1.6 oz) Gen: obese WM in mild distress, fatigued CVS:no rub Resp:bibasilar crackles RAX:ENMMH, +BS Ext:1+ edema   Recent Labs Lab 10/13/16 0456 10/14/16 0530 10/15/16 0454 10/15/16 1330 10/16/16 0542 10/17/16 0430 10/18/16 0410  NA 126* 124* 123* 125* 124* 123* 122*  K 3.9 4.4 4.4 3.8 4.2 3.9 3.8  CL 85* 78* 75* 78* 75* 75* 76*  CO2 _1 32 30 29  GLUCOSE 123* 142* 133* 119* 92 95 86  BUN 57* 67* 80* 85* 97* 115* 117*  CREATININE 3.32* 3.39* 3.59* 3.50* 3.60* 3.68* 3.84*  ALBUMIN 1.4* 1.5* 1.7*  --  1.7* 1.7* 1.7*  CALCIUM 8.4* 8.9 8.6* 8.3* 8.3* 7.9* 8.1*  PHOS 5.2* 5.6* 6.4*  --  5.8* 5.0* 4.8*   Liver Function Tests:  Recent Labs Lab 10/16/16 0542 10/17/16 0430 10/18/16 0410  ALBUMIN 1.7* 1.7* 1.7*   No results for input(s): LIPASE, AMYLASE in the last 168 hours. No results for input(s): AMMONIA in the last 168 hours. CBC:  Recent Labs Lab 10/14/16 0530 10/15/16 0454 10/16/16 0542 10/17/16 0430 10/18/16 0410  WBC 5.2 4.2 2.6* 3.3* 3.6*  HGB 8.7* 8.1* 7.6* 8.0* 7.7*  HCT 25.8* 24.1* 23.1* 23.5* 22.9*  MCV 89.6 90.6 90.9 89.4 89.8  PLT 66* 52* 40* 38* 35*   Cardiac Enzymes: No results for input(s): CKTOTAL, CKMB, CKMBINDEX, TROPONINI in the last 168  hours. CBG:  Recent Labs Lab 10/16/16 1133  GLUCAP 105*    Iron Studies: No results for input(s): IRON, TIBC, TRANSFERRIN, FERRITIN in the last 72 hours. Studies/Results: No results found. Marland Kitchen acyclovir  200 mg Oral Daily  . [START ON 10/20/2016] bortezomib SQ  1.3 mg/m2 Subcutaneous Once  . calcium acetate  667 mg Oral TID WC  . [START ON 10/20/2016] dexamethasone  20 mg Intravenous Daily  . docusate sodium  100 mg Oral BID  . mouth rinse  15 mL Mouth Rinse BID  . pantoprazole  40 mg Oral Daily  . polyethylene glycol  17 g Oral Daily  . sodium chloride flush  10-40 mL Intracatheter Q12H  . vitamin B-12  1,000 mcg Oral Daily    BMET    Component Value Date/Time   NA 122 (L) 10/18/2016 0410   K 3.8 10/18/2016 0410   CL 76 (L) 10/18/2016 0410   CO2 29 10/18/2016 0410   GLUCOSE 86 10/18/2016 0410   BUN 117 (H) 10/18/2016 0410   CREATININE 3.84 (H) 10/18/2016 0410   CALCIUM 8.1 (  L) 10/18/2016 0410   GFRNONAA 15 (L) 10/18/2016 0410   GFRAA 18 (L) 10/18/2016 0410   CBC    Component Value Date/Time   WBC 3.6 (L) 10/18/2016 0410   RBC 2.55 (L) 10/18/2016 0410   HGB 7.7 (L) 10/18/2016 0410   HCT 22.9 (L) 10/18/2016 0410   PLT 35 (L) 10/18/2016 0410   MCV 89.8 10/18/2016 0410   MCH 30.2 10/18/2016 0410   MCHC 33.6 10/18/2016 0410   RDW 18.9 (H) 10/18/2016 0410   LYMPHSABS 0.7 10/08/2016 0147   MONOABS 0.5 10/08/2016 0147   EOSABS 0.0 10/08/2016 0147   BASOSABS 0.0 10/08/2016 0147     Assessment/Plan:  1. AKI of unclear etiology. Scr was 1.16 on 09/26/16 and upt to 1.87 on 10/02/16 and now up to 3.5. DDx includes myeloma or amyloidosis involvement of kidneys, acute GN, cardiorenal syndrome, AIN.  1. No indication for dialysis at this time, however BUN is rising, although labs pending for today  2. Agree with holding lasix (has actually been diuresing some without it) 3. He is a poor candidate for HD and would recommend transitioning to comfort measures as he is  failing aggressive therapy at this time.  4. Would not offer CVVHD as this would be a futile escalation of care.    2. Multiple myeloma- per Heme velcade and dexamethasone. 3. 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 Currently on dopamine gtt. Cardiology working up. 1. Was diuresing with lasix and pressors, however developing worsening azotemia 2. Diuretics on hold.  4. Morbid ovesity 5. Pancytopenia- due to #2 6. Disposition- poor overall prognosis and agree with Palliative care consult   Donetta Potts, MD [facility] [phone]

## 2016-10-19 NOTE — Progress Notes (Signed)
Order received for 1 unit PRBC's to be transfused. Verbalized Understanding. Premed for blood ordered and given per MD order. Verbalized understanding. VSS.

## 2016-10-19 NOTE — Progress Notes (Signed)
PROGRESS NOTE        PATIENT DETAILS Name: Lance [name] Age: 61 y.o. Sex: male Date of Birth: Jan 10, 1956 Admit Date: 10/06/2016 Admitting Physician Verlee Monte, MD PCP:No primary care provider on file.  Brief Narrative: Patient is a 61 y.o. male with history of transfusion dependent multiple myeloma, chronic systolic heart failure was initially admitted to [facility] on 2/7 for decompensated systolic heart failure. His renal function continued to worsen, as a result he was transferred to [facility] for evaluation. He was subsequently placed on high-dose diuretics with only some mild improvement, subsequently oncology was consulted to see if treatment of underlying myeloma while inpatient would improve some of his symptoms. He subsequently began inpatient chemotherapy. See below for further details.  Subjective: Remains very frail and weak. Sitting up in chair. Had episode of vomiting this morning.   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 Although his volume status has improved compared to his initial presentation (> -25 L), he still appears volume overloaded and has worsening hyponatremia. Diuretics currently on hold due to worsening renal function, he remains on low-dose dopamine. His overall prognosis is very poor, and if no significant improvement in the next few days, may need to consider transition to hospice care. Cardiology and palliative care following.  Acute on chronic kidney injury stage III: ARF probably multifactorial in a setting of multiple myeloma  and probably some element of cardiorenal syndrome.Per renal-patient not a candidate for renal replacement therapy. Diuretics on hold, nephrology following. Follow urine output, await further recommendations from nephrology.  Acute hypoxic respiratory failure: Secondary to  decompensated CHF, will continue to slowly titrate down oxygen as tolerated.  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  Hyponatremia: Secondary to hyperkalemia in a setting of systolic heart failure and renal failure. Continue to follow electrolytes.  Goals of care/palliative care: Patient aware of poor overall prognoses-and continued slow deterioration in spite of aggressive care. DO NOT RESUSCITATE in place. Palliative care following, suspect that if no further clinical improvement over the next few days-may need to transition to full comfort measures. Started to discuss comfort care options with patient this morning, but he started vomiting. Will await further recommendations from the palliative care team and continue to discuss options with patient and his siblings. Patient expresses interest in comfort care but would like his siblings to agree.   DVT Prophylaxis: Not on pharmacological prophylaxis given severe thrombocytopenia-has bilateral lower extremity compression wraps.  Code Status:  DNR  Family Communication: None at bedside-palliative care reaching out to family today-to schedule meeting.  Disposition Plan: Remain inpatient-requires several more days of hospitalization.  Antimicrobial agents: Anti-infectives    Start     Dose/Rate Route Frequency Ordered Stop   10/12/16 1000  acyclovir (ZOVIRAX) 200 MG capsule 200 mg     200 mg Oral Daily 10/12/16 0739        Procedures: Echo 2/10>>EF 35%-diffuse hypokinesis. RV systolic function severely reduced.  CONSULTS:  cardiology, nephrology and Palliative care  Time spent: 25- minutes-Greater than 50% of this time was spent in counseling, explanation of diagnosis, planning of  further management, and  coordination of care.  MEDICATIONS: Scheduled Meds: . acyclovir  200 mg Oral Daily  . [START ON 10/20/2016] bortezomib SQ  1.3 mg/m2 Subcutaneous Once  . calcium acetate  667 mg Oral TID WC  . [START ON 10/20/2016] dexamethasone  20 mg Intravenous Daily  . docusate sodium  100 mg Oral BID  . mouth rinse  15 mL Mouth Rinse BID  . pantoprazole  40 mg Oral Daily  . polyethylene glycol  17 g Oral Daily  . sodium chloride flush  10-40 mL Intracatheter Q12H  . vitamin B-12  1,000 mcg Oral Daily   Continuous Infusions: . DOPamine 2.5 mcg/kg/min (10/18/16 1918)   PRN Meds:.acetaminophen **OR** acetaminophen, calcium carbonate, camphor-menthol **AND** hydrOXYzine, docusate sodium, feeding supplement (NEPRO CARB STEADY), ipratropium-albuterol, ondansetron **OR** ondansetron (ZOFRAN) IV, ondansetron (ZOFRAN) IV, oxyCODONE, sodium chloride, sodium chloride flush, sorbitol, traZODone, zolpidem   PHYSICAL EXAM: Vital signs: Vitals:   10/19/16 0730 10/19/16 0758 10/19/16 0835 10/19/16 0955  BP:   131/63   Pulse: 84 86 80 80  Resp: 20 (!) _0 Temp:   98.8 F (37.1 C)   TempSrc:   Oral   SpO2: 94% 95% 91% 95%  Weight:      Height:       Filed Weights   10/17/16 0445 10/18/16 0420 10/19/16 0356  Weight: 122.2 kg (269 lb 4.8 oz) 122.7 kg (270 lb 6.4 oz) 123.6 kg (272 lb 8 oz)   Body mass index is 39.1 kg/m.   General appearance :Awake, alert, not in any distress. Frail and chronically sick appearing Eyes:, pupils equally reactive to light and accomodation,no scleral icterus. HEENT: Atraumatic and Normocephalic Neck: supple Resp:Good air entry bilaterally, no added sounds  CVS: S1 S2 regular, no murmurs.  GI: Bowel sounds present, Non tender and not distended with no gaurding, rigidity or rebound. Extremities: B/L Lower Ext with bilateral compression wraps-but still appears to have 1-2+ edema Neurology:  speech clear,Non focal-but with generalized weakness Psychiatric: Normal  judgment and insight. Alert and oriented x 3.  Musculoskeletal:No digital cyanosis Skin:No Rash, warm and dry Wounds:N/A  I have personally reviewed following labs and imaging studies  LABORATORY DATA: CBC:  Recent Labs Lab 10/14/16 0530 10/15/16 0454 10/16/16 0542 10/17/16 0430 10/18/16 0410  WBC 5.2 4.2 2.6* 3.3* 3.6*  HGB 8.7* 8.1* 7.6* 8.0* 7.7*  HCT 25.8* 24.1* 23.1* 23.5* 22.9*  MCV 89.6 90.6 90.9 89.4 89.8  PLT 66* 52* 40* 38* 35*    Basic Metabolic Panel:  Recent Labs Lab 10/14/16 0530 10/15/16 0454 10/15/16 1330 10/16/16 0542 10/17/16 0430 10/18/16 0410  NA 124* 123* 125* 124* 123* 122*  K 4.4 4.4 3.8 4.2 3.9 3.8  CL 78* 75* 78* 75* 75* 76*  CO2 _1 32 30 29  GLUCOSE 142* 133* 119* 92 95 86  BUN 67* 80* 85* 97* 115* 117*  CREATININE 3.39* 3.59* 3.50* 3.60* 3.68* 3.84*  CALCIUM 8.9 8.6* 8.3* 8.3* 7.9* 8.1*  PHOS 5.6* 6.4*  --  5.8* 5.0* 4.8*    GFR: Estimated Creatinine Clearance: 26.3 mL/min (by C-G formula based on SCr of 3.84 mg/dL (H)).  Liver Function Tests:  Recent Labs Lab 10/14/16 0530 10/15/16 0454 10/16/16 0542 10/17/16 0430 10/18/16 0410  ALBUMIN 1.5* 1.7* 1.7* 1.7* 1.7*   No results for input(s): LIPASE, AMYLASE in the last 168 hours. No results for input(s): AMMONIA in the last 168 hours.  Coagulation Profile: No results for input(s): INR, PROTIME  in the last 168 hours.  Cardiac Enzymes: No results for input(s): CKTOTAL, CKMB, CKMBINDEX, TROPONINI in the last 168 hours.  BNP (last 3 results) No results for input(s): PROBNP in the last 8760 hours.  HbA1C: No results for input(s): HGBA1C in the last 72 hours.  CBG:  Recent Labs Lab 10/16/16 1133  GLUCAP 105*    Lipid Profile: No results for input(s): CHOL, HDL, LDLCALC, TRIG, CHOLHDL, LDLDIRECT in the last 72 hours.  Thyroid Function Tests: No results for input(s): TSH, T4TOTAL, FREET4, T3FREE, THYROIDAB in the last 72 hours.  Anemia Panel: No results for  input(s): VITAMINB12, FOLATE, FERRITIN, TIBC, IRON, RETICCTPCT in the last 72 hours.  Urine analysis:    Component Value Date/Time   COLORURINE RED (A) 10/06/2016 2210   APPEARANCEUR CLOUDY (A) 10/06/2016 2210   LABSPEC 1.012 10/06/2016 2210   PHURINE 6.0 10/06/2016 2210   GLUCOSEU 50 (A) 10/06/2016 2210   HGBUR MODERATE (A) 10/06/2016 2210   BILIRUBINUR NEGATIVE 10/06/2016 2210   KETONESUR NEGATIVE 10/06/2016 2210   PROTEINUR 100 (A) 10/06/2016 2210   NITRITE NEGATIVE 10/06/2016 2210   LEUKOCYTESUR NEGATIVE 10/06/2016 2210    Sepsis Labs: Lactic Acid, Venous No results found for: LATICACIDVEN  MICROBIOLOGY: Recent Results (from the past 240 hour(s))  MRSA PCR Screening     Status: None   Collection Time: 10/09/16  1:26 PM  Result Value Ref Range Status   MRSA by PCR NEGATIVE NEGATIVE Final    Comment:        The GeneXpert MRSA Assay (FDA approved for NASAL specimens only), is one component of a comprehensive MRSA colonization surveillance program. It is not intended to diagnose MRSA infection nor to guide or monitor treatment for MRSA infections.     RADIOLOGY STUDIES/RESULTS: Dg Lumbar Spine 1 View  Result Date: 10/15/2016 CLINICAL DATA:  61 year old male with back pain and a history of transfusion dependent multiple myeloma. Admitted to [facility] earlier this month, transferred for nephrology consultation. EXAM: LUMBAR SPINE - 1 VIEW COMPARISON:  [facility] CT Abdomen and Pelvis 10/05/2016. [facility] radiographic bone survey 07/15/2014. FINDINGS: Standing AP view of the lumbar spine. Normal lumbar segmentation. This single view is most easily compared to the 2015 bone survey AP view of the lumbar spine. Chronic mild L1 and L2 compression appears stable since that time. L3 through L5 vertebral body height appears stable. Visible lower thoracic levels appear grossly stable, see also thoracic spine series from today reported separately. IMPRESSION:  Stable AP radiographic appearance of lumbar spine since 2015. Electronically Signed   By: Genevie Ann M.D.   On: 10/15/2016 14:09   Dg Thoracic Spine 1 View  Result Date: 10/15/2016 CLINICAL DATA:  61 year old male with back pain and a history of transfusion dependent multiple myeloma. Admitted to [facility] earlier this month, transferred for nephrology consultation. EXAM: THORACIC SPINE 1 VIEW COMPARISON:  [facility] portable chest radiograph 08/27/2016 and radiographic skeletal survey 07/15/2014. Standing AP view of the lumbar spine today reported separately. FINDINGS: Normal thoracic segmentation. AP thoracic vertebral height and alignment appears stable since 2015. Bone mineralization appears stable. Streaky left lung base opacity and small left pleural effusion are evident. Partially visible right PICC line or central line. IMPRESSION: 1. Stable AP radiographic appearance of the thoracic spine since 2015. 2. Partially visible left pleural effusion and streaky left lung base opacity. Electronically Signed   By: Genevie Ann M.D.   On: 10/15/2016 14:12   US Renal  Result Date: 10/07/2016  CLINICAL DATA:  Acute renal failure EXAM: RENAL / URINARY TRACT ULTRASOUND COMPLETE COMPARISON:  None. FINDINGS: Right Kidney: Length: 12.2 cm. Echogenicity within normal limits. No mass or hydronephrosis visualized. Left Kidney: Length: 12.7 cm. Echogenicity within normal limits. No mass or hydronephrosis visualized. Bladder: The bladder is decompressed with a Foley catheter. Ascites is seen in the abdomen. IMPRESSION: 1. Limited views of the kidneys due to patient body habitus. No acute abnormality. 2. Ascites. Electronically Signed   By: Dorise Bullion III M.D   On: 10/07/2016 21:29   Portable Chest 1 View  Result Date: 10/06/2016 CLINICAL DATA:  Chest pain, history of pneumonia and wheezing. EXAM: PORTABLE CHEST 1 VIEW COMPARISON:  10/04/2016 FINDINGS: There is stable cardiomegaly. No aortic aneurysm. Mild  vascular congestion consistent with CHF. Small bilateral pleural effusions with trace fluid in the right minor fissure. Subsegmental atelectasis in the left mid lung versus scarring. Bibasilar densities cannot exclude superimposed pneumonia. No acute osseous abnormality. IMPRESSION: Stable cardiomegaly with mild CHF. Superimposed bibasilar pneumonia would be difficult to entirely exclude. Trace bilateral pleural effusions. Electronically Signed   By: Ashley Royalty M.D.   On: 10/06/2016 22:09     LOS: 13 days   Kyra Leyland, PA-S   If 7PM-7AM, please contact night-coverage [email or web address] Password [facility] 10/19/2016, 10:22 AM  Attending MD note  Patient was seen, examined,treatment plan was discussed with the PA-S.  I have personally reviewed the clinical findings, lab, imaging studies and management of this patient in detail. I agree with the documentation, as recorded by the PA-S.   Patient has had several episodes of vomiting this morning. He appears very frail and weak. He is in pain and complains of lack of sleep during this hospital stay.  On Exam: Gen. exam: Awake, alert, not in any distress-but chronically ill-appearing and frail. Chest: Good air entry bilaterally CVS: S1-S2 regular, no murmurs Abdomen: Soft, nontender and nondistended Neurology: Non-focal  Impression Probable end-stage systolic heart failure due to multiple myeloma Acute kidney injury-multifactorial-but high suspicion for amyloidosis due to multiple myeloma Pancytopenia due to multiple myeloma Hyponatremia due to hypervolemia Very poor overall prognosis-served best by transitioning to full comfort measures.  Plan: Continue current measures-did briefly speak to patient regarding end-of-life issues/initiation of comfort measures-he subsequently had vomiting. Palliative care has since reached out to family-family meeting to be scheduled soon. In the interim-we'll transfuse 1 unit of PRBC and follow clinical  course. Spoke with nephrology today-not a candidate for renal replacement therapy   Rest as above  [facility]

## 2016-10-19 NOTE — Progress Notes (Signed)
Advanced Heart Failure Rounding Note   Subjective:    61 y/o male with apparent longstanding multiple myeloma (per Dr. 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   2/12 started dopamine.  Remains on low-dose dopamine. Off lasix. Weak and nauseated this am. Fatigued.  BUN/Cr continue to rise 123/3.9. Albumin 1.6   Getting treatment for multiple myeloma with Velcade and dexamethasone.    Objective:    Vital Signs:   Temp:  [97.6 F (36.4 C)-98.8 F (37.1 C)] 97.7 F (36.5 C) (02/22 1154) Pulse Rate:  [75-86] 77 (02/22 1233) Resp:  [12-23] 13 (02/22 1233) BP: (106-136)/(50-72) 136/72 (02/22 1154) SpO2:  [80 %-98 %] 93 % (02/22 1233) FiO2 (%):  [98 %] 98 % (02/22 0835) Weight:  [123.6 kg (272 lb 8 oz)] 123.6 kg (272 lb 8 oz) (02/22 0356) Last BM Date: 10/17/16  Weight change: Filed Weights   10/17/16 0445 10/18/16 0420 10/19/16 0356  Weight: 122.2 kg (269 lb 4.8 oz) 122.7 kg (270 lb 6.4 oz) 123.6 kg (272 lb 8 oz)    Intake/Output:   Intake/Output Summary (Last 24 hours) at 10/19/16 1315 Last data filed at 10/19/16 0900  Gross per 24 hour  Intake           1020.4 ml  Output             1550 ml  Net           -529.6 ml     Physical Exam: General: Seated on side of bed. Fatigued appearing.   HEENT: normal, R periorbital ecchymosis. Some epistaxis Neck: supple. JVP to jaw Carotids 2+ bilat; no bruits. No thyromegaly or nodule noted.  Cor: PMI nondisplaced. RRR. No rubs, gallops 2/6 TR Lungs: Diminished throughout, on RA.   Abdomen: obese, soft, NT, mildly distended, no HSM. No bruits or masses. +BS  Extremities: no cyanosis, clubbing, rash, R and LLE 1+ edema Neuro: alert & orientedx3, cranial nerves grossly intact. moves all 4 extremities w/o difficulty. Affect flat.   Telemetry: Reviewed, NSR  80-90s    Labs: Basic Metabolic Panel:  Recent Labs Lab 10/15/16 0454 10/15/16 1330 10/16/16 0542 10/17/16 0430 10/18/16 0410 10/19/16 1057  NA 123* 125* 124* 123* 122* 123*  K 4.4 3.8 4.2 3.9 3.8 3.9  CL 75* 78* 75* 75* 76* 80*  CO2 30 29 32 _0 GLUCOSE 133* 119* 92 95 86 95  BUN 80* 85* 97* 115* 117* 123*  CREATININE 3.59* 3.50* 3.60* 3.68* 3.84* 3.90*  CALCIUM 8.6* 8.3* 8.3* 7.9* 8.1* 8.2*  MG  --   --   --   --   --  2.3  PHOS 6.4*  --  5.8* 5.0* 4.8* 4.5    Liver Function Tests:  Recent Labs Lab 10/15/16 0454 10/16/16 0542 10/17/16 0430 10/18/16 0410 10/19/16 1057  AST  --   --   --   --  25  ALT  --   --   --   --  26  ALKPHOS  --   --   --   --  85  BILITOT  --   --   --   --  2.1*  PROT  --   --   --   --  11.5*  ALBUMIN 1.7* 1.7* 1.7* 1.7* 1.6*    CBC:  Recent Labs Lab 10/15/16 0454 10/16/16 0542 10/17/16 0430 10/18/16 0410 10/19/16 1057  WBC 4.2 2.6* 3.3* 3.6* 3.3*  HGB 8.1* 7.6* 8.0* 7.7* 7.1*  HCT 24.1* 23.1* 23.5* 22.9* 21.2*  MCV 90.6 90.9 89.4 89.8 89.5  PLT 52* 40* 38* 35* 35*    Cardiac Enzymes: No results for input(s): CKTOTAL, CKMB, CKMBINDEX, TROPONINI in the last 168 hours.  BNP: BNP (last 3 results)  Recent Labs  10/06/16 2101  BNP 1,535.9*    Medications:     Scheduled Medications: . sodium chloride   Intravenous Once  . acetaminophen  650 mg Oral Once  . acyclovir  200 mg Oral Daily  . [START ON 10/20/2016] bortezomib SQ  1.3 mg/m2 Subcutaneous Once  . calcium acetate  667 mg Oral TID WC  . [START ON 10/20/2016] dexamethasone  20 mg Intravenous Daily  . diphenhydrAMINE  25 mg Intravenous Once  . docusate sodium  100 mg Oral BID  . mouth rinse  15 mL Mouth Rinse BID  . pantoprazole  40 mg Oral Daily  . polyethylene glycol  17 g Oral Daily  . sodium chloride flush  10-40 mL Intracatheter Q12H  . vitamin B-12  1,000 mcg Oral Daily    Infusions: . DOPamine 2.5 mcg/kg/min (10/18/16 1918)    PRN  Medications: acetaminophen **OR** acetaminophen, calcium carbonate, camphor-menthol **AND** hydrOXYzine, docusate sodium, feeding supplement (NEPRO CARB STEADY), ipratropium-albuterol, ondansetron **OR** ondansetron (ZOFRAN) IV, ondansetron (ZOFRAN) IV, oxyCODONE, sodium chloride, sodium chloride flush, sorbitol, traZODone, zolpidem   Assessment:   1. 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 Acute on chronic Renal Failure 3. Anemia 4. Multiple Myeloma 5. Hyponatremia 6. Pancytopenia  7. Epistaxis   Plan/Discussion:    Getting progressively worse with progressive multi-system organ failure in setting of cardiac amyloidosis and chemotherapy for his MM and  I had a long talk with him about his prognosis and have suggested proceeding with comfort care. He is in favor of this but wants to talk to his sister first. She has not been receptive to pulling back care. Appreciate Palliative Care team's input. D/w Dr. Nigel Bridgeman at bedside. Will stop dopamine.  Length of Stay: 13  Glori Bickers, MD  10/19/2016, 1:15 PM  Advanced Heart Failure Team Please contact [facility] for night-coverage after hours (4p -7a ) and weekends on [email or web address]

## 2016-10-20 DIAGNOSIS — Z515 Encounter for palliative care: Secondary | ICD-10-CM

## 2016-10-20 LAB — CBC
HCT: 21.8 % — ABNORMAL LOW (ref 39.0–52.0)
HEMOGLOBIN: 7.3 g/dL — AB (ref 13.0–17.0)
MCH: 29.7 pg (ref 26.0–34.0)
MCHC: 33.5 g/dL (ref 30.0–36.0)
MCV: 88.6 fL (ref 78.0–100.0)
Platelets: 31 10*3/uL — ABNORMAL LOW (ref 150–400)
RBC: 2.46 MIL/uL — ABNORMAL LOW (ref 4.22–5.81)
RDW: 18.7 % — AB (ref 11.5–15.5)
WBC: 2.8 10*3/uL — ABNORMAL LOW (ref 4.0–10.5)

## 2016-10-20 LAB — RENAL FUNCTION PANEL
ANION GAP: 17 — AB (ref 5–15)
Albumin: 1.6 g/dL — ABNORMAL LOW (ref 3.5–5.0)
BUN: 128 mg/dL — AB (ref 6–20)
CALCIUM: 8.2 mg/dL — AB (ref 8.9–10.3)
CO2: 27 mmol/L (ref 22–32)
CREATININE: 4.77 mg/dL — AB (ref 0.61–1.24)
Chloride: 78 mmol/L — ABNORMAL LOW (ref 101–111)
GFR calc Af Amer: 14 mL/min — ABNORMAL LOW (ref >60)
GFR calc non Af Amer: 12 mL/min — ABNORMAL LOW (ref >60)
GLUCOSE: 89 mg/dL (ref 65–99)
Phosphorus: 4.5 mg/dL (ref 2.5–4.6)
Potassium: 4.1 mmol/L (ref 3.5–5.1)
SODIUM: 122 mmol/L — AB (ref 135–145)

## 2016-10-20 LAB — TYPE AND SCREEN
BLOOD PRODUCT EXPIRATION DATE: 201803142359
ISSUE DATE / TIME: 201802221644
UNIT TYPE AND RH: 6200

## 2016-10-20 MED ORDER — LORAZEPAM 1 MG PO TABS: 1.0000 mg | ORAL_TABLET | Freq: Four times a day (QID) | ORAL | Status: DC | PRN

## 2016-10-20 MED ORDER — NEPRO/CARBSTEADY PO LIQD: 237.0000 mL | Freq: Three times a day (TID) | ORAL | 0 refills | Status: AC | PRN

## 2016-10-20 MED ORDER — LORAZEPAM 2 MG/ML PO CONC: 1.0000 mg | ORAL | 0 refills | Status: AC | PRN

## 2016-10-20 MED ORDER — TORSEMIDE 20 MG PO TABS: 80.0000 mg | ORAL_TABLET | Freq: Two times a day (BID) | ORAL | 0 refills | Status: AC

## 2016-10-20 MED ORDER — OXYCODONE HCL 5 MG PO TABS: 5.0000 mg | ORAL_TABLET | ORAL | Status: DC | PRN

## 2016-10-20 MED ORDER — OXYCODONE HCL 5 MG/5ML PO SOLN: 5.0000 mg | ORAL | 0 refills | Status: AC | PRN

## 2016-10-20 MED ORDER — OXYCODONE HCL 5 MG PO TABS: 5.0000 mg | ORAL_TABLET | ORAL | 0 refills | Status: DC | PRN

## 2016-10-20 NOTE — Progress Notes (Signed)
Advanced Heart Failure Rounding Note   Subjective:    Continues to deteriorate. Renal function worse. Very fatigued. Weight stable at 271.   Patient initially was OK with [facility]ospice Home but sister refused saying we ae "forcing" diagnosis on him.   Plan is to d/c home today. Sister currently talking to Palliative Care team and asked me to leave.    Objective:    Vital Signs:   Temp:  [97.3 F (36.3 C)-97.8 F (36.6 C)] 97.3 F (36.3 C) (02/23 0754) Pulse Rate:  [68-84] 71 (02/23 0754) Resp:  [12-18] 16 (02/23 0754) BP: (99-136)/(48-72) 99/54 (02/23 0754) SpO2:  [80 %-100 %] 100 % (02/23 0754) Weight:  [123.2 kg (271 lb 9.7 oz)] 123.2 kg (271 lb 9.7 oz) (02/23 0436) Last BM Date: 10/17/16  Weight change: Filed Weights   10/18/16 0420 10/19/16 0356 10/20/16 0436  Weight: 122.7 kg (270 lb 6.4 oz) 123.6 kg (272 lb 8 oz) 123.2 kg (271 lb 9.7 oz)    Intake/Output:   Intake/Output Summary (Last 24 hours) at 10/20/16 0943 Last data filed at 10/20/16 0910  Gross per 24 hour  Intake              945 ml  Output              175 ml  Net              770 ml     Physical Exam: General: Seated in chair Fatigued appearing.   HEENT: normal, R periorbital ecchymosis. Neck: supple. JVP to jaw Carotids 2+ bilat; no bruits. No thyromegaly or nodule noted.  Cor: PMI nondisplaced. RRR. No rubs, gallops 2/6 TR Lungs: Diminished throughout, on RA.   Abdomen: obese, soft, NT, mildly distended, no HSM. No bruits or masses. +BS  Extremities: no cyanosis, clubbing, rash, R and LLE 1+ edema. + wraps Neuro: alert & orientedx3, cranial nerves grossly intact. moves all 4 extremities w/o difficulty. Affect flat.   Telemetry: Reviewed, NSR 70-80s  Personally reviewed   Labs: Basic Metabolic Panel:  Recent Labs Lab 10/16/16 0542 10/17/16 0430 10/18/16 0410 10/19/16 1057 10/20/16 0450  NA 124* 123* 122* 123* 122*  K 4.2 3.9 3.8 3.9 4.1  CL 75* 75* 76* 80* 78*  CO2 32 30 29 27 27     GLUCOSE 92 95 86 95 89  BUN 97* 115* 117* 123* 128*  CREATININE 3.60* 3.68* 3.84* 3.90* 4.77*  CALCIUM 8.3* 7.9* 8.1* 8.2* 8.2*  MG  --   --   --  2.3  --   PHOS 5.8* 5.0* 4.8* 4.5 4.5    Liver Function Tests:  Recent Labs Lab 10/16/16 0542 10/17/16 0430 10/18/16 0410 10/19/16 1057 10/20/16 0450  AST  --   --   --  25  --   ALT  --   --   --  26  --   ALKPHOS  --   --   --  85  --   BILITOT  --   --   --  2.1*  --   PROT  --   --   --  11.5*  --   ALBUMIN 1.7* 1.7* 1.7* 1.6* 1.6*    CBC:  Recent Labs Lab 10/16/16 0542 10/17/16 0430 10/18/16 0410 10/19/16 1057 10/20/16 0450  WBC 2.6* 3.3* 3.6* 3.3* 2.8*  HGB 7.6* 8.0* 7.7* 7.1* 7.3*  HCT 23.1* 23.5* 22.9* 21.2* 21.8*  MCV 90.9 89.4 89.8 89.5 88.6  PLT 40* 38*  35* 35* 31*    Cardiac Enzymes: No results for input(s): CKTOTAL, CKMB, CKMBINDEX, TROPONINI in the last 168 hours.  BNP: BNP (last 3 results)  Recent Labs  10/06/16 2101  BNP 1,535.9*    Medications:     Scheduled Medications: . sodium chloride   Intravenous Once  . acyclovir  200 mg Oral Daily  . calcium acetate  667 mg Oral TID WC  . dexamethasone  20 mg Intravenous Daily  . docusate sodium  100 mg Oral BID  . mouth rinse  15 mL Mouth Rinse BID  . pantoprazole  40 mg Oral Daily  . polyethylene glycol  17 g Oral Daily  . sodium chloride flush  10-40 mL Intracatheter Q12H  . vitamin B-12  1,000 mcg Oral Daily    Infusions:   PRN Medications: acetaminophen **OR** acetaminophen, calcium carbonate, camphor-menthol **AND** hydrOXYzine, docusate sodium, feeding supplement (NEPRO CARB STEADY), ipratropium-albuterol, ondansetron **OR** ondansetron (ZOFRAN) IV, ondansetron (ZOFRAN) IV, oxyCODONE, sodium chloride, sodium chloride flush, sorbitol, traZODone, zolpidem   Assessment:   1. 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 Acute on chronic Renal Failure 3. Anemia 4. Multiple Myeloma 5. Hyponatremia 6.  Pancytopenia  7. Epistaxis   Plan/Discussion:    Getting progressively worse with progressive multi-system organ failure in setting of cardiac amyloidosis and chemotherapy for his MM. I agree strongly with recommendations for Comfort Care as he is terminally ill and at very high-risk for acute deterioration and worsening symptoms. I stressed to him and his sister the need to make sure he has resources to deal with a deterioration so he can avoid suffering.   They are planning to take him home today. Would send home on torsemide 80 bid. I d/w Dr. Nigel Bridgeman.   Appreciate efforts of Palliative Care team greatly.   Length of Stay: 14  Glori Bickers, MD  10/20/2016, 9:43 AM  Advanced Heart Failure Team Please contact [facility] for night-coverage after hours (4p -7a ) and weekends on [email or web address]

## 2016-10-20 NOTE — Discharge Summary (Addendum)
PATIENT DETAILS Name: Lance [name] Age: 61 y.o. Sex: male Date of Birth: [birth year] MRN: [id number]. Admitting Physician: Verlee Monte, MD PCP:No primary care provider on file.  Admit Date: 10/06/2016 Discharge date: 10/20/2016  Recommendations for Outpatient Follow-up:  1. Follow up with PCP and Primary Oncologist as needed 2. Goals of care are mostly for comfort-but family refusing home or residential hospice care. Family also refusing any sort of nursing assistance at home.  He is a DNR 3. Please obtain BMP/CBC in one week-if felt to be necessary by Primary MD or Primary Oncologist.  4. Initiate hospice care-if patient/family is willing to accept  Admitted From:  [facility]  Disposition: [place]: No  Equipment/Devices: Wheelchair, bedside commode, rolling walker  Discharge Condition: Guarded with very poor prognoses  CODE STATUS: DNR  Diet recommendation:  Heart Healthy   Brief Summary: See H&P, Labs, Consult and Test reports for all details in brief, Patient is a 61 y.o. male with history of transfusion dependent multiple myeloma, chronic systolic heart failure was initially admitted to [facility] on 2/7 for decompensated systolic heart failure. His renal function continued to worsen, as a result he was transferred to [facility] for evaluation. He was subsequently placed on high-dose diuretics with only some mild improvement-was followed very closely by cardiology and nephrology.Subsequently oncology was consulted to see if treatment of underlying myeloma while inpatient would improve some of his symptoms. He subsequently began inpatient chemotherapy. Unfortunately in spite of maximal medical care and input from different subspecialties-patient continued to deteriorate with worsening in renal function and is now becoming oliguric.palliative care has been following patient closely throughout this hospital stay-family does not want to  go with either home hospice or to residential hospice-they're aware of very poor prognoses, they want to take him home.See below for further details.  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 Although his volume status has improved compared to his initial presentation (> -25 L), he still appears volume overloaded and has persistent hyponatremia. Diuretics were briefly placed on hold due to worsening renal function-he was treated with low-dose dopamine infusion. Unfortunately he continues to have worsening renal function-no further recommendations from CHF team apart from pursuing comfort measures. Patient and family have aware of very poor overall prognoses. Spoke with Dr. Haroldine Laws today, recommendations are to discharge on torsemide 80 mg BID and to emphasize importance of comfort care/hospice care to patient and family.  Acute on chronic kidney injury stage III: ARF probably multifactorial in a setting of multiple myeloma  and probably some element of cardiorenal syndrome.he unfortunately continues to have worsening renal function, his urine output over the past is only 175 mL.With increasing BUN and occasional nausea and vomiting-there is some suspicion for Uremic symptoms. Given his overall medical situation-per nephrology he is not a hemodialysis candidate-recommendations from nephrology and also to pursue comfort measures. See discussion regarding palliative care below  Acute hypoxic respiratory failure: Secondary to decompensated CHF, have asked RN to see if he qualifies for home oxygen on discharge.Family agreeable to utilize oxygen if he qualifies.  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 Because of worsening renal function oncology recommending holding Velcade.  I have spoken with oncology-Dr. Sanjuan Dame discussed that patient continues to deteriorate with worsening renal function and ongoing volume excess/hyponatremia/decreased urine output. Oncology aware that patient is not a hemodialysis candidate. Oncology in agreement with recommendations to transition to comfort measures. No further recommendations from oncology apart from follow-up up with his primary oncologist on discharge. Note-per documentation-patient's primary oncologist in [facility] had recommended treatment more than a year ago but patient has decided not to start treatment during that time.  Hyponatremia: Secondary to hyperkalemia in a setting of systolic heart failure and renal failure. Continue to follow electrolytes.  Goals of care/palliative care: Patient aware of poor overall prognoses-and continued slow deterioration in spite of aggressive care including inpatient chemotherapy. DO NOT RESUSCITATE in place. Patient and family (specialty's sister) is fully aware that patient will continue to deteriorate-nephrology/cardiology/hospitalist M.D. have one recommended that best course of action is to transition to full comfort measures with either residential hospice or hospice care at home. While family is aware of high likelihood of further deterioration and very limited life expectancy of a few weeks, family refuses home hospice, residential hospice and [place]. Although the patient's sometime is agreeable to going home with hospice care or even to residential hospice, after discussion with family members he changes his mind. We again had a prolonged family discussion earlier in the morning (see progress note done earlier today) with myself, and the palliative care team. Patient's sister was still  adamantly against hospice care at a facility or at home. She feels that she could care for him at home like he is being cared in the hospital "I have a recliner at home - where he can watch TV-just like he is doing here at the hospital". Patient's sister also does NOT want any home health services including RN to come out to the house for compression wraps-she plans to take him to all his doctor's appointments! Patient's sister (who the patient defers most of his decisions too) is very reluctant even using narcotics or benzodiazepine for comfort measures. After much discussion-she reluctantly was agreeable to take a prescription for oxycodone and ativan as a back up-"just in case".Per patient's sister-she has had multiple deaths in her family-and is very confident that she can provide care to the patient while he is dying.   Procedures/Studies: Echo 2/10>>EF 35%-diffuse hypokinesis. RV systolic function severely reduced.  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[facility])   Advance care planning   Goals of care, counseling/discussion   Palliative care by specialist   Bleeding diathesis ([place])   Thrombocytopenia ([place])   Hyponatremia   Discharge Instructions:  Activity:  As tolerated with Full fall precautions use walker/cane & assistance as needed  Discharge Instructions    Diet - low sodium heart healthy    Complete by:  As directed    Increase activity slowly    Complete by:  As directed      Allergies as of 10/20/2016      Reactions   Morphine And Related Other (See Comments)     "makes me craxy - talk out of my  head - yell"   Penicillins    Unknown Has patient had a PCN reaction causing immediate rash, facial/tongue/throat swelling, SOB or lightheadedness with hypotension: YES Has patient had a PCN reaction causing severe rash involving mucus membranes or skin necrosis: NO Has patient had a PCN reaction that required hospitalization NO Has patient had a PCN reaction occurring within the last 10 years: NO If all of the above answers are "NO", then may proceed with Cephalosporin use.   Tobacco [nicotiana Tabacum]       Medication List    STOP taking these medications   ferrous gluconate 324 MG tablet Commonly known as:  FERGON   furosemide 40 MG tablet Commonly known as:  LASIX   lisinopril 2.5 MG tablet Commonly known as:  PRINIVIL,ZESTRIL   potassium chloride 10 MEQ tablet Commonly known as:  K-DUR,KLOR-CON   PROAIR HFA 108 (90 Base) MCG/ACT inhaler Generic drug:  albuterol   RENO CAPS 1 MG Caps     TAKE these medications   feeding supplement (NEPRO CARB STEADY) Liqd Take 237 mLs by mouth 3 (three) times daily as needed (Supplement).   LORazepam 2 MG/ML concentrated solution Commonly known as:  ATIVAN Take 0.5 mLs (1 mg total) by mouth every 4 (four) hours as needed for anxiety or sleep.   oxyCODONE 5 MG/5ML solution Commonly known as:  ROXICODONE Take 5 mLs (5 mg total) by mouth every 4 (four) hours as needed for severe pain (anxiety, sedation, shortness of breath).   torsemide 20 MG tablet Commonly known as:  DEMADEX Take 4 tablets (80 mg total) by mouth 2 (two) times daily.            Durable Medical Equipment        Start     Ordered   10/20/16 1002  For home use only DME wheelchair cushion (seat and back)  Once     10/20/16 1002   10/20/16 0917  For home use only DME 4 wheeled rolling walker with seat  Once    Question:  Patient needs a walker to treat with the following condition  Answer:  Physical deconditioning    10/20/16 0916   10/20/16 0917  For home use only DME Bedside commode  Once    Question:  Patient needs a bedside commode to treat with the following condition  Answer:  Physical deconditioning   10/20/16 0916     Follow-up Information    LEWIS,DEQUINCY A., MD. Schedule an appointment as soon as possible for a visit in 1 week(s).   Specialty:  Oncology Contact information: [address]. [place] [address] [phone]        Primary MD. Schedule an appointment as soon as possible for a visit in 1 week(s).          Allergies  Allergen Reactions  . Morphine And Related Other (See Comments)    "makes me craxy - talk out of my head - yell"  . Penicillins     Unknown Has patient had a PCN reaction causing immediate rash, facial/tongue/throat swelling, SOB or lightheadedness with hypotension: YES Has patient had a PCN reaction causing severe rash involving mucus membranes or skin necrosis: NO Has patient had a PCN reaction that required hospitalization NO Has patient had a PCN reaction occurring within the last 10 years: NO If all of the above answers are "NO", then may proceed with Cephalosporin use.  . Tobacco [place] Tabacum]     Consultations:   cardiology, nephrology,  hematology/oncology and Palliative care  Other Procedures/Studies: Dg Lumbar Spine 1 View  Result Date: 10/15/2016 CLINICAL DATA:  61 year old male with back pain and a history of transfusion dependent multiple myeloma. Admitted to [facility] earlier this month, transferred for nephrology consultation. EXAM: LUMBAR SPINE - 1 VIEW COMPARISON:  [facility] CT Abdomen and Pelvis 10/05/2016. [facility] radiographic bone survey 07/15/2014. FINDINGS: Standing AP view of the lumbar spine. Normal lumbar segmentation. This single view is most easily compared to the 2015 bone survey AP view of the lumbar spine. Chronic mild L1 and L2 compression appears stable since that time. L3 through L5  vertebral body height appears stable. Visible lower thoracic levels appear grossly stable, see also thoracic spine series from today reported separately. IMPRESSION: Stable AP radiographic appearance of lumbar spine since 2015. Electronically Signed   By: Genevie Ann M.D.   On: 10/15/2016 14:09   Dg Thoracic Spine 1 View  Result Date: 10/15/2016 CLINICAL DATA:  61 year old male with back pain and a history of transfusion dependent multiple myeloma. Admitted to [facility] earlier this month, transferred for nephrology consultation. EXAM: THORACIC SPINE 1 VIEW COMPARISON:  [facility] portable chest radiograph 08/27/2016 and radiographic skeletal survey 07/15/2014. Standing AP view of the lumbar spine today reported separately. FINDINGS: Normal thoracic segmentation. AP thoracic vertebral height and alignment appears stable since 2015. Bone mineralization appears stable. Streaky left lung base opacity and small left pleural effusion are evident. Partially visible right PICC line or central line. IMPRESSION: 1. Stable AP radiographic appearance of the thoracic spine since 2015. 2. Partially visible left pleural effusion and streaky left lung base opacity. Electronically Signed   By: Genevie Ann M.D.   On: 10/15/2016 14:12   US Renal  Result Date: 10/07/2016 CLINICAL DATA:  Acute renal failure EXAM: RENAL / URINARY TRACT ULTRASOUND COMPLETE COMPARISON:  None. FINDINGS: Right Kidney: Length: 12.2 cm. Echogenicity within normal limits. No mass or hydronephrosis visualized. Left Kidney: Length: 12.7 cm. Echogenicity within normal limits. No mass or hydronephrosis visualized. Bladder: The bladder is decompressed with a Foley catheter. Ascites is seen in the abdomen. IMPRESSION: 1. Limited views of the kidneys due to patient body habitus. No acute abnormality. 2. Ascites. Electronically Signed   By: Dorise Bullion III M.D   On: 10/07/2016 21:29   Portable Chest 1 View  Result Date: 10/06/2016 CLINICAL DATA:   Chest pain, history of pneumonia and wheezing. EXAM: PORTABLE CHEST 1 VIEW COMPARISON:  10/04/2016 FINDINGS: There is stable cardiomegaly. No aortic aneurysm. Mild vascular congestion consistent with CHF. Small bilateral pleural effusions with trace fluid in the right minor fissure. Subsegmental atelectasis in the left mid lung versus scarring. Bibasilar densities cannot exclude superimposed pneumonia. No acute osseous abnormality. IMPRESSION: Stable cardiomegaly with mild CHF. Superimposed bibasilar pneumonia would be difficult to entirely exclude. Trace bilateral pleural effusions. Electronically Signed   By: Ashley Royalty M.D.   On: 10/06/2016 22:09     TODAY-DAY OF DISCHARGE:  Subjective:   Lance [name] remains unchanged-he is still nauseous-but no longer vomiting. He has had multiple d/w his sister-and wants to go home today.He is FULLY AWARE of poor overall prognoses.  Objective:   Blood pressure (!) 99/54, pulse 71, temperature 97.3 F (36.3 C), temperature source Oral, resp. rate 16, height 5' 10"  (1.778 m), weight 123.2 kg (271 lb 9.7 oz), SpO2 100 %.  Intake/Output Summary (Last 24 hours) at 10/20/16 1006 Last data filed at 10/20/16 0910  Gross per 24 hour  Intake  945 ml  Output              175 ml  Net              770 ml   Filed Weights   10/18/16 0420 10/19/16 0356 10/20/16 0436  Weight: 122.7 kg (270 lb 6.4 oz) 123.6 kg (272 lb 8 oz) 123.2 kg (271 lb 9.7 oz)    Exam: Awake Alert, Oriented *3, No new F.N deficits, Normal affect [place].AT,PERRAL Supple Neck,No JVD, No cervical lymphadenopathy appriciated.  Symmetrical Chest wall movement, Good air movement bilaterally, CTAB RRR,No Gallops,Rubs or new Murmurs, No Parasternal Heave +ve B.Sounds, Abd Soft, Non tender, No organomegaly appriciated, No rebound -guarding or rigidity. No Cyanosis, Clubbing or edema, No new Rash or bruise   PERTINENT RADIOLOGIC STUDIES: Dg Lumbar Spine 1 View  Result Date:  10/15/2016 CLINICAL DATA:  61 year old male with back pain and a history of transfusion dependent multiple myeloma. Admitted to [facility] earlier this month, transferred for nephrology consultation. EXAM: LUMBAR SPINE - 1 VIEW COMPARISON:  [facility] CT Abdomen and Pelvis 10/05/2016. [facility] radiographic bone survey 07/15/2014. FINDINGS: Standing AP view of the lumbar spine. Normal lumbar segmentation. This single view is most easily compared to the 2015 bone survey AP view of the lumbar spine. Chronic mild L1 and L2 compression appears stable since that time. L3 through L5 vertebral body height appears stable. Visible lower thoracic levels appear grossly stable, see also thoracic spine series from today reported separately. IMPRESSION: Stable AP radiographic appearance of lumbar spine since 2015. Electronically Signed   By: Genevie Ann M.D.   On: 10/15/2016 14:09   Dg Thoracic Spine 1 View  Result Date: 10/15/2016 CLINICAL DATA:  61 year old male with back pain and a history of transfusion dependent multiple myeloma. Admitted to [facility] earlier this month, transferred for nephrology consultation. EXAM: THORACIC SPINE 1 VIEW COMPARISON:  [facility] portable chest radiograph 08/27/2016 and radiographic skeletal survey 07/15/2014. Standing AP view of the lumbar spine today reported separately. FINDINGS: Normal thoracic segmentation. AP thoracic vertebral height and alignment appears stable since 2015. Bone mineralization appears stable. Streaky left lung base opacity and small left pleural effusion are evident. Partially visible right PICC line or central line. IMPRESSION: 1. Stable AP radiographic appearance of the thoracic spine since 2015. 2. Partially visible left pleural effusion and streaky left lung base opacity. Electronically Signed   By: Genevie Ann M.D.   On: 10/15/2016 14:12   US Renal  Result Date: 10/07/2016 CLINICAL DATA:  Acute renal failure EXAM: RENAL /  URINARY TRACT ULTRASOUND COMPLETE COMPARISON:  None. FINDINGS: Right Kidney: Length: 12.2 cm. Echogenicity within normal limits. No mass or hydronephrosis visualized. Left Kidney: Length: 12.7 cm. Echogenicity within normal limits. No mass or hydronephrosis visualized. Bladder: The bladder is decompressed with a Foley catheter. Ascites is seen in the abdomen. IMPRESSION: 1. Limited views of the kidneys due to patient body habitus. No acute abnormality. 2. Ascites. Electronically Signed   By: Dorise Bullion III M.D   On: 10/07/2016 21:29   Portable Chest 1 View  Result Date: 10/06/2016 CLINICAL DATA:  Chest pain, history of pneumonia and wheezing. EXAM: PORTABLE CHEST 1 VIEW COMPARISON:  10/04/2016 FINDINGS: There is stable cardiomegaly. No aortic aneurysm. Mild vascular congestion consistent with CHF. Small bilateral pleural effusions with trace fluid in the right minor fissure. Subsegmental atelectasis in the left mid lung versus scarring. Bibasilar densities cannot exclude superimposed pneumonia. No acute osseous abnormality. IMPRESSION:  Stable cardiomegaly with mild CHF. Superimposed bibasilar pneumonia would be difficult to entirely exclude. Trace bilateral pleural effusions. Electronically Signed   By: Ashley Royalty M.D.   On: 10/06/2016 22:09     PERTINENT LAB RESULTS: CBC:  Recent Labs  10/19/16 1057 10/20/16 0450  WBC 3.3* 2.8*  HGB 7.1* 7.3*  HCT 21.2* 21.8*  PLT 35* 31*   CMET CMP     Component Value Date/Time   NA 122 (L) 10/20/2016 0450   K 4.1 10/20/2016 0450   CL 78 (L) 10/20/2016 0450   CO2 27 10/20/2016 0450   GLUCOSE 89 10/20/2016 0450   BUN 128 (H) 10/20/2016 0450   CREATININE 4.77 (H) 10/20/2016 0450   CALCIUM 8.2 (L) 10/20/2016 0450   PROT 11.5 (H) 10/19/2016 1057   ALBUMIN 1.6 (L) 10/20/2016 0450   AST 25 10/19/2016 1057   ALT 26 10/19/2016 1057   ALKPHOS 85 10/19/2016 1057   BILITOT 2.1 (H) 10/19/2016 1057   GFRNONAA 12 (L) 10/20/2016 0450   GFRAA 14 (L)  10/20/2016 0450    GFR Estimated Creatinine Clearance: 21.1 mL/min (by C-G formula based on SCr of 4.77 mg/dL (H)). No results for input(s): LIPASE, AMYLASE in the last 72 hours. No results for input(s): CKTOTAL, CKMB, CKMBINDEX, TROPONINI in the last 72 hours. Invalid input(s): POCBNP No results for input(s): DDIMER in the last 72 hours. No results for input(s): HGBA1C in the last 72 hours. No results for input(s): CHOL, HDL, LDLCALC, TRIG, CHOLHDL, LDLDIRECT in the last 72 hours. No results for input(s): TSH, T4TOTAL, T3FREE, THYROIDAB in the last 72 hours.  Invalid input(s): FREET3 No results for input(s): VITAMINB12, FOLATE, FERRITIN, TIBC, IRON, RETICCTPCT in the last 72 hours. Coags: No results for input(s): INR in the last 72 hours.  Invalid input(s): PT Microbiology: No results found for this or any previous visit (from the past 240 hour(s)).  FURTHER DISCHARGE INSTRUCTIONS:  Get Medicines reviewed and adjusted: Please take all your medications with you for your next visit with your Primary MD  Laboratory/radiological data: Please request your Primary MD to go over all hospital tests and procedure/radiological results at the follow up, please ask your Primary MD to get all Hospital records sent to his/her office.  In some cases, they will be blood work, cultures and biopsy results pending at the time of your discharge. Please request that your primary care M.D. goes through all the records of your hospital data and follows up on these results.  Also Note the following: If you experience worsening of your admission symptoms, develop shortness of breath, life threatening emergency, suicidal or homicidal thoughts you must seek medical attention immediately by calling 911 or calling your MD immediately  if symptoms less severe.  You must read complete instructions/literature along with all the possible adverse reactions/side effects for all the Medicines you take and that have  been prescribed to you. Take any new Medicines after you have completely understood and accpet all the possible adverse reactions/side effects.   Do not drive when taking Pain medications or sleeping medications (Benzodaizepines)  Do not take more than prescribed Pain, Sleep and Anxiety Medications. It is not advisable to combine anxiety,sleep and pain medications without talking with your primary care practitioner  Special Instructions: If you have smoked or chewed Tobacco  in the last 2 yrs please stop smoking, stop any regular Alcohol  and or any Recreational drug use.  Wear Seat belts while driving.  Please note: You were cared for  by a hospitalist during your hospital stay. Once you are discharged, your primary care physician will handle any further medical issues. Please note that NO REFILLS for any discharge medications will be authorized once you are discharged, as it is imperative that you return to your primary care physician (or establish a relationship with a primary care physician if you do not have one) for your post hospital discharge needs so that they can reassess your need for medications and monitor your lab values.  Total Time spent coordinating discharge including counseling, education and face to face time equals 65 minutes.  SignedOren Binet 10/20/2016 10:06 AM

## 2016-10-20 NOTE — Progress Notes (Addendum)
Daily Progress Note   Patient Name: Lance [name]       Date: 10/20/2016 DOB: 07/02/1956  Age: 61 y.o. MRN#: [id number] Attending Physician: Jonetta Osgood, MD Primary Care Physician: No primary care provider on file. Admit Date: 10/06/2016  Reason for Consultation/Follow-up: Establishing goals of care in the setting of end stage multiple myeloma, renal failure and heart failure.  Subjective: I initially met with the patient alone.  He expressed concern that his death would be too difficult for his sister at home.  Also described the sensation that his throat was closing up - he recognized this as panic.  Dr. Sloan Leiter and the patient's sister Sharyn Lull then entered the room.  As a group we addressed disposition to home vs to hospice home.  Michelle's decision was firm.  The patient appeared to relent to his sister's wishes.  We reviewed the MOST form and DNR - as Michelle's plan is to bring him back to the ER if he is in distress.  Sharyn Lull allowed me to complete the formal DNR, but refused to sign the MOST form.  She felt uncomfortable signing anything.  Then [place] and I met alone.  I spent approx 90 min with her providing psychological and spiritual support.  In the end we reviewed the use of comfort medications and I gave her my cell phone number to call for support.  She understands he is bleeding.  She understands he is dying.  Her intentions are excellent but very unrealistic.  I anticipate Mr. Petro will return the ER very quickly and I have asked Sharyn Lull to contact me/PMT when he arrives so that we can implement comfort measures.  Patient Profile/HPI: 61 y.o. male  with past medical history of transfusion dependent multiple myeloma, sys HF (EF 35-40%), pancytopenia, hypertension  admitted on 10/06/2016 with for CHF exacerbation. He was transferred to [facility] from [facility] for nephrology consult d/t AKI in setting of sever CHF and volume overload not responding to high-dose diuretics.  Palliative medicine consulted for [place].   Length of Stay: 14  Current Medications: Scheduled Meds:  . sodium chloride   Intravenous Once  . acyclovir  200 mg Oral Daily  . calcium acetate  667 mg Oral TID WC  . docusate sodium  100 mg Oral BID  . mouth rinse  15  mL Mouth Rinse BID  . pantoprazole  40 mg Oral Daily  . polyethylene glycol  17 g Oral Daily  . sodium chloride flush  10-40 mL Intracatheter Q12H  . vitamin B-12  1,000 mcg Oral Daily    Continuous Infusions:   PRN Meds: acetaminophen **OR** acetaminophen, calcium carbonate, camphor-menthol **AND** hydrOXYzine, docusate sodium, feeding supplement (NEPRO CARB STEADY), ipratropium-albuterol, LORazepam, ondansetron **OR** ondansetron (ZOFRAN) IV, ondansetron (ZOFRAN) IV, oxyCODONE, sodium chloride, sodium chloride flush, sorbitol, traZODone, zolpidem  Physical Exam       Obese very pleasant man.   He is alert, orientated, and clear thinking.  He appears pale and fatigued. He can stand on his own.   Vital Signs: BP (!) 99/54 (BP Location: Left Arm)   Pulse 71   Temp 97.3 F (36.3 C) (Oral)   Resp 16   Ht _0  (1.778 m)   Wt 123.2 kg (271 lb 9.7 oz)   SpO2 100%   BMI 38.97 kg/m  SpO2: SpO2: 100 % O2 Device: O2 Device: Not Delivered O2 Flow Rate: O2 Flow Rate (L/min): 2 L/min  Intake/output summary:   Intake/Output Summary (Last 24 hours) at 10/20/16 1127 Last data filed at 10/20/16 1660  Gross per 24 hour  Intake              945 ml  Output              175 ml  Net              770 ml   LBM: Last BM Date: 10/17/16 Baseline Weight: Weight: (!) 143.6 kg (316 lb 9.3 oz) Most recent weight: Weight: 123.2 kg (271 lb 9.7 oz)       Palliative Assessment/Data:    Flowsheet Rows   Flowsheet Row Most  Recent Value  Intake Tab  Referral Department  Hospitalist  Unit at Time of Referral  Cardiac/Telemetry Unit  Palliative Care Primary Diagnosis  Cancer  Date Notified  10/09/16  Palliative Care Type  New Palliative care  Reason for referral  Clarify Goals of Care  Date of Admission  10/06/16  # of days IP prior to Palliative referral  3  Clinical Assessment  Palliative Performance Scale Score  40%  Nausea Max Last 24 Hours  7  Nausea Min Last 24 Hours  3  Psychosocial & Spiritual Assessment  Palliative Care Outcomes  Patient/Family meeting held?  Yes  Who was at the meeting?  patient and sister  Palliative Care Outcomes  Clarified goals of care      Patient Active Problem List   Diagnosis Date Noted  . Hyponatremia   . Thrombocytopenia ([place])   . Bleeding diathesis ([place])   . Advance care planning   . Goals of care, counseling/discussion   . Palliative care by specialist   . 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 Acute respiratory failure with hypoxia ([place]) 10/07/2016  . Hyperkalemia 10/07/2016  . Hyperphosphatemia 10/07/2016  . Hypocalcemia 10/07/2016  . Antineoplastic chemotherapy induced pancytopenia ([place]) 10/07/2016  . Multiple myeloma not having achieved remission ([place]) 10/07/2016  . Chest pain   . Acute on chronic combined systolic and diastolic congestive heart failure ([place]) 10/06/2016  . ARF (acute renal failure) ([place]) 10/06/2016  . HTN (hypertension) 10/06/2016  . Anasarca 10/06/2016    Palliative Care Plan    Recommendations/Plan:  D/C to sister's home with comfort medications.  His sister has refused all home health services  If he returns to the ER please  immediately implement comfort measures and contact the Palliative Medicine Team.     Goals of Care and Additional Recommendations:  Limitations on Scope of Treatment: Full Comfort Care  Code Status:  DNR  Prognosis:   Hours - Days - given advanced multiple myeloma, heart failure and kidney failure.Marland Kitchen     Discharge Planning:  To sister's home and care.  Care plan was discussed with  Patient, Sister, and [facility] MD.   Thank you for allowing the Palliative Medicine Team to assist in the care of this patient.  Time in:  9:00 Time out:  10:30 Total time spent:  90 min.     Greater than 50%  of this time was spent counseling and coordinating care related to the above assessment and plan.  Imogene Burn, PA-C Palliative Medicine  Please contact Palliative MedicineTeam phone at [phone] for questions and concerns between 7 am - 7 pm.   Please see AMION for individual provider pager numbers.

## 2016-10-20 NOTE — Progress Notes (Signed)
Patient ID: Lance [name], male   DOB: 04/27/1956, 61 y.o.   MRN: [id number] S:Cardiology and Hospitalist notes reviewed as well as Palliative Care.  Mr. Stmartin sister is planning on taking him home today without hospice services to help care for him while he is dying. O:BP (!) 99/54 (BP Location: Left Arm)   Pulse 71   Temp 97.3 F (36.3 C) (Oral)   Resp 16   Ht _0  (1.778 m)   Wt 123.2 kg (271 lb 9.7 oz)   SpO2 100%   BMI 38.97 kg/m   Intake/Output Summary (Last 24 hours) at 10/20/16 0833 Last data filed at 10/20/16 0601  Gross per 24 hour  Intake              945 ml  Output              175 ml  Net              770 ml   Intake/Output: I/O last 3 completed shifts: In: 1605.4 [P.O.:1040; I.V.:90.4; Blood:335; Other:140] Out: 1725 [Urine:1725]  Intake/Output this shift:  No intake/output data recorded. Weight change: -0.405 kg (-14.3 oz) PPI:RJJOA, weak and somnolent CZY:SAYTK HS Resp:bilateral rales ZSW:FUXNA3 Ext:1+ edema   Recent Labs Lab 10/14/16 0530 10/15/16 0454 10/15/16 1330 10/16/16 0542 10/17/16 0430 10/18/16 0410 10/19/16 1057 10/20/16 0450  NA 124* 123* 125* 124* 123* 122* 123* 122*  K 4.4 4.4 3.8 4.2 3.9 3.8 3.9 4.1  CL 78* 75* 78* 75* 75* 76* 80* 78*  CO2 _1 32 _2 GLUCOSE 142* 133* 119* 92 95 86 95 89  BUN 67* 80* 85* 97* 115* 117* 123* 128*  CREATININE 3.39* 3.59* 3.50* 3.60* 3.68* 3.84* 3.90* 4.77*  ALBUMIN 1.5* 1.7*  --  1.7* 1.7* 1.7* 1.6* 1.6*  CALCIUM 8.9 8.6* 8.3* 8.3* 7.9* 8.1* 8.2* 8.2*  PHOS 5.6* 6.4*  --  5.8* 5.0* 4.8* 4.5 4.5  AST  --   --   --   --   --   --  25  --   ALT  --   --   --   --   --   --  26  --    Liver Function Tests:  Recent Labs Lab 10/18/16 0410 10/19/16 1057 10/20/16 0450  AST  --  25  --   ALT  --  26  --   ALKPHOS  --  85  --   BILITOT  --  2.1*  --   PROT  --  11.5*  --   ALBUMIN 1.7* 1.6* 1.6*   No results for input(s): LIPASE, AMYLASE in the last 168 hours. No results  for input(s): AMMONIA in the last 168 hours. CBC:  Recent Labs Lab 10/16/16 0542 10/17/16 0430 10/18/16 0410 10/19/16 1057 10/20/16 0450  WBC 2.6* 3.3* 3.6* 3.3* 2.8*  HGB 7.6* 8.0* 7.7* 7.1* 7.3*  HCT 23.1* 23.5* 22.9* 21.2* 21.8*  MCV 90.9 89.4 89.8 89.5 88.6  PLT 40* 38* 35* 35* 31*   Cardiac Enzymes: No results for input(s): CKTOTAL, CKMB, CKMBINDEX, TROPONINI in the last 168 hours. CBG:  Recent Labs Lab 10/16/16 1133  GLUCAP 105*    Iron Studies: No results for input(s): IRON, TIBC, TRANSFERRIN, FERRITIN in the last 72 hours. Studies/Results: No results found. . sodium chloride   Intravenous Once  . acyclovir  200 mg Oral Daily  . bortezomib SQ  1.3 mg/m2 Subcutaneous Once  . calcium  acetate  667 mg Oral TID WC  . dexamethasone  20 mg Intravenous Daily  . docusate sodium  100 mg Oral BID  . mouth rinse  15 mL Mouth Rinse BID  . pantoprazole  40 mg Oral Daily  . polyethylene glycol  17 g Oral Daily  . sodium chloride flush  10-40 mL Intracatheter Q12H  . vitamin B-12  1,000 mcg Oral Daily    BMET    Component Value Date/Time   NA 122 (L) 10/20/2016 0450   K 4.1 10/20/2016 0450   CL 78 (L) 10/20/2016 0450   CO2 27 10/20/2016 0450   GLUCOSE 89 10/20/2016 0450   BUN 128 (H) 10/20/2016 0450   CREATININE 4.77 (H) 10/20/2016 0450   CALCIUM 8.2 (L) 10/20/2016 0450   GFRNONAA 12 (L) 10/20/2016 0450   GFRAA 14 (L) 10/20/2016 0450   CBC    Component Value Date/Time   WBC 2.8 (L) 10/20/2016 0450   RBC 2.46 (L) 10/20/2016 0450   HGB 7.3 (L) 10/20/2016 0450   HCT 21.8 (L) 10/20/2016 0450   PLT 31 (L) 10/20/2016 0450   MCV 88.6 10/20/2016 0450   MCH 29.7 10/20/2016 0450   MCHC 33.5 10/20/2016 0450   RDW 18.7 (H) 10/20/2016 0450   LYMPHSABS 0.7 10/08/2016 0147   MONOABS 0.5 10/08/2016 0147   EOSABS 0.0 10/08/2016 0147   BASOSABS 0.0 10/08/2016 0147     Assessment/Plan:  1. AKI of unclear etiology. Scr was 1.16 on 09/26/16 and upt to 1.87 on 10/02/16  and now up to 3.5. DDx includes myeloma or amyloidosis involvement of kidneys, acute GN, cardiorenal syndrome, AIN.  1. He is a poor candidate for HD and would recommend transitioning to comfort measures as he is failing aggressive therapy at this time.  2. Would not offer CVVHD as this would be a futile escalation of care.   3. Agree with transitioning to comfort care 2. Multiple myeloma- per Heme velcade and dexamethasone. 3. 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 Currently on dopamine gtt. Cardiology working up. 1. Was diuresing with lasix and pressors, however developing worsening azotemia 2. Diuretics on hold.  4. Morbid ovesity 5. Pancytopenia- due to #2 6. Disposition- poor overall prognosis and agree with transition to comfort measures (would prefer home hospice or [facility], however sister feels well prepared to care for him at home.  Will sign off  Donetta Potts, MD [employer] [phone]

## 2016-10-20 NOTE — Progress Notes (Signed)
Oncology Short Note  10/20/2016  Given thrombocytopenia and significant anemia and a bump in his bilirubin to 2.1. We shall hold his scheduled dose of Velcade today. Proceed with only Dexamethasone as ordered.  Sullivan Lone MD MS

## 2016-10-20 NOTE — Progress Notes (Signed)
SATURATION QUALIFICATIONS: (This note is used to comply with regulatory documentation for home oxygen)  Patient Saturations on Room Air at Rest = 93%  Patient Saturations on Room Air while Ambulating = 91%  Patient Saturations on2 Liters of oxygen while Ambulating = 99%  Please briefly explain why patient needs home oxygen:

## 2016-10-20 NOTE — Progress Notes (Signed)
Long d/w patient's sister and patient at bedside. Patient seems to defer most decisions to his sister-however when alone with Palliative care he did express concern about going home with sister and was open to going to residential hospice. However when we had d/w him in presence of his sister-he changed his mind. After PROLONGED discussion-patient and his sister want the following:  Home with sister. Sister ADAMANTLY refuses any home health services INCLUDING nursing  She is very hesitant to even want to use Morphine (bad experience in the past) or Ativan for comfort-however seems to be open with Oxycodone (he received in [facility]). She was finally ok with as needed Benzo's for comfort as well  She plans to take care of him "till the end"-and claims that "we will cross that bridge" when she can no longer take care of him. She claims she will call EMS in such a situation as well.She also still plans to take him to wound care and all his doctors appointment "like before". Adamantly refuses any home health services.  She is very well aware that he is dying and is prognoses very poor-with increasing creatinine and with decreasing urine output.  Palliative care in room with patient and family-for further deliniation of care-and to see if we can get MOST form filled out.   Tentative plans are for discharge today-spoke with Dr Wynetta Fines call Oncology as well.   See d/c summary for further details.

## 2016-10-20 NOTE — Progress Notes (Signed)
Orthopedic Tech Progress Note Patient Details:  Lance [name] 09-Oct-1955 [id number]:[id number]  Ortho Devices Type of Ortho Device: Louretta Parma boot Ortho Device/Splint Location: bilateral Ortho Device/Splint Interventions: Application   Nicko Daher 10/20/2016, 7:30 AM

## 2016-12-26 DEATH — deceased

## 2018-12-12 IMAGING — US US RENAL
1 series · 14 of 25 positions shown · non-contrast
Comparison: None.

CLINICAL DATA: Acute renal failure

EXAM:
RENAL / URINARY TRACT ULTRASOUND COMPLETE

[Series 1: us renal · 0.30mm/px · 14 of 33 slices shown]
[im 1/33]
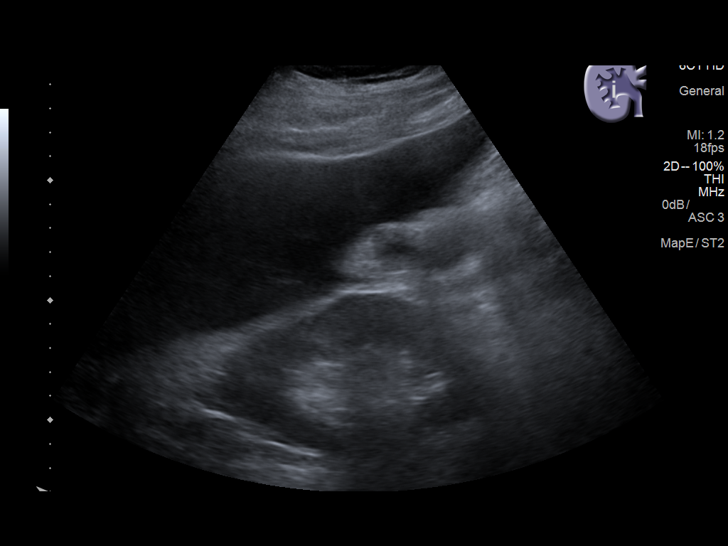
[im 3/33]
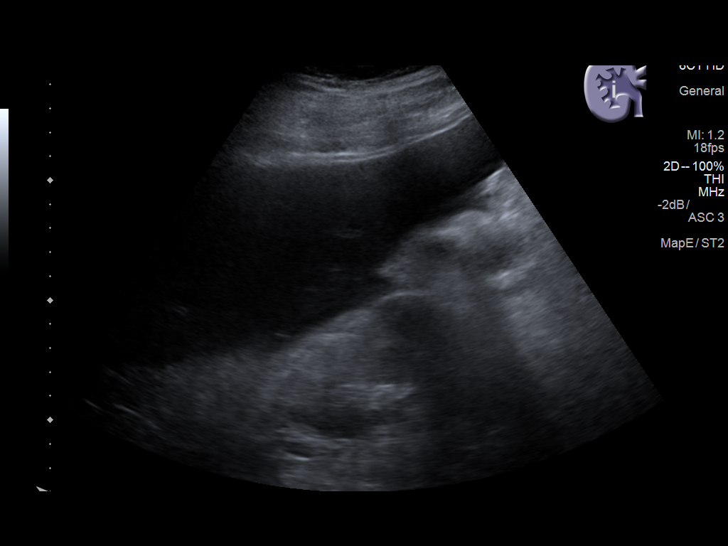
[im 6/33]
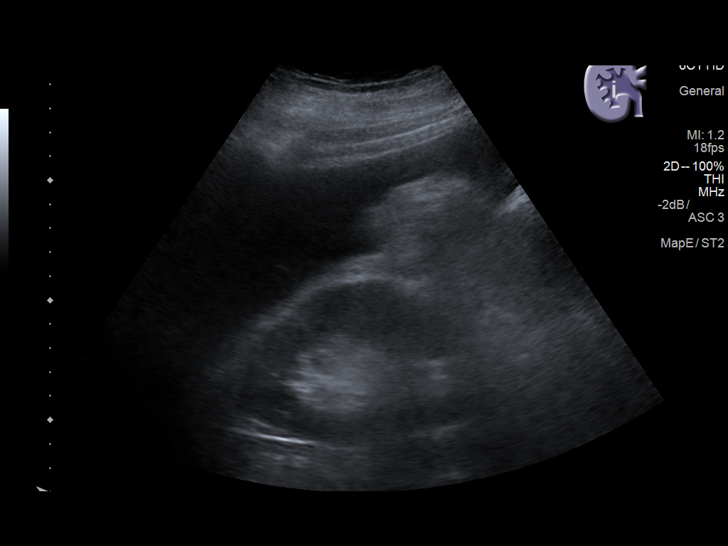
[im 9/33]
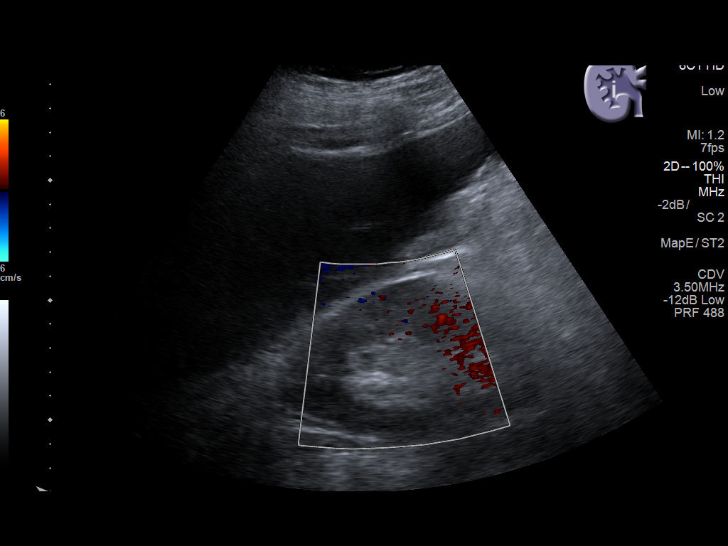
[im 11/33]
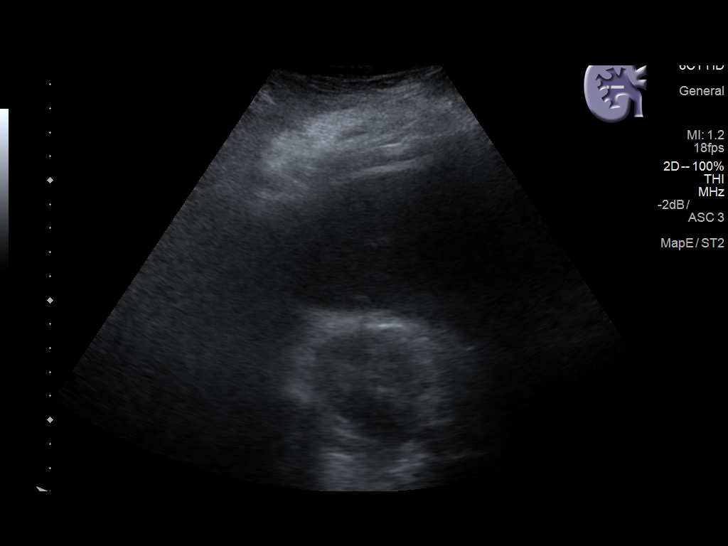
[im 13/33]
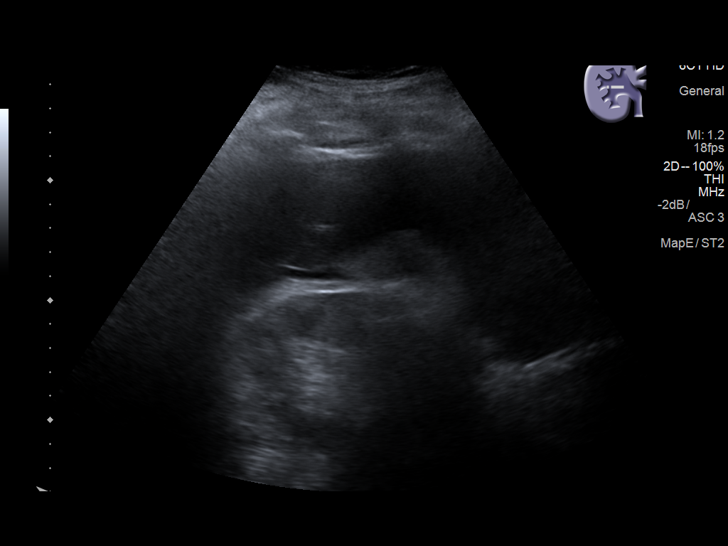
[im 15/33]
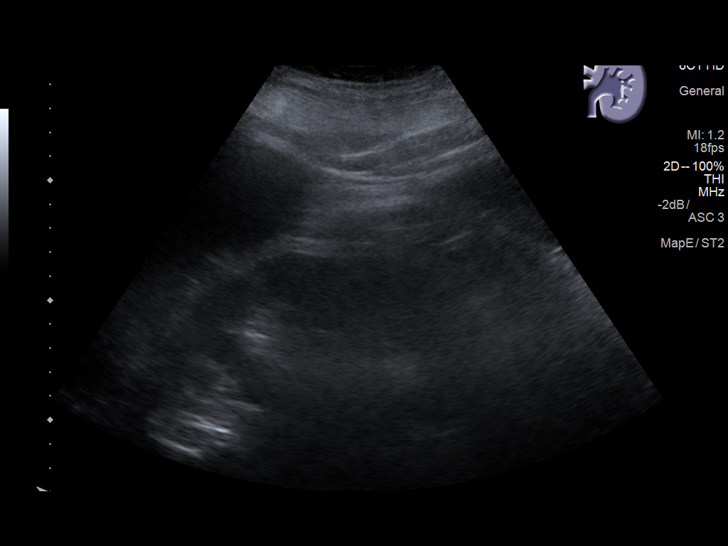
[im 18/33]
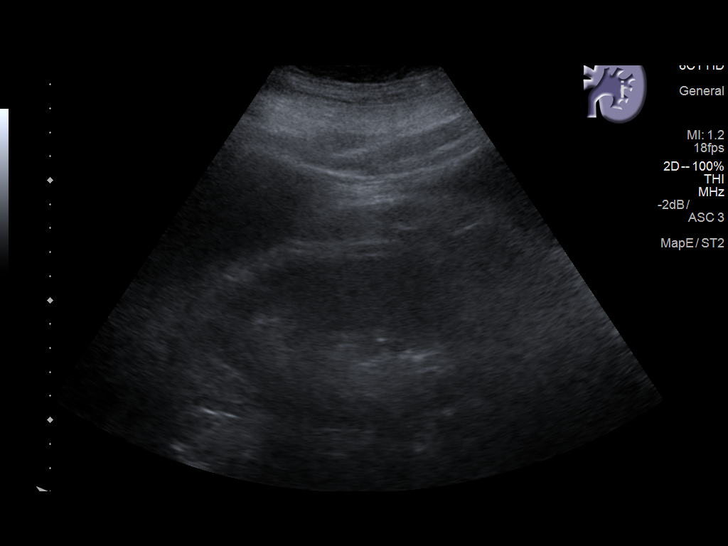
[im 21/33]
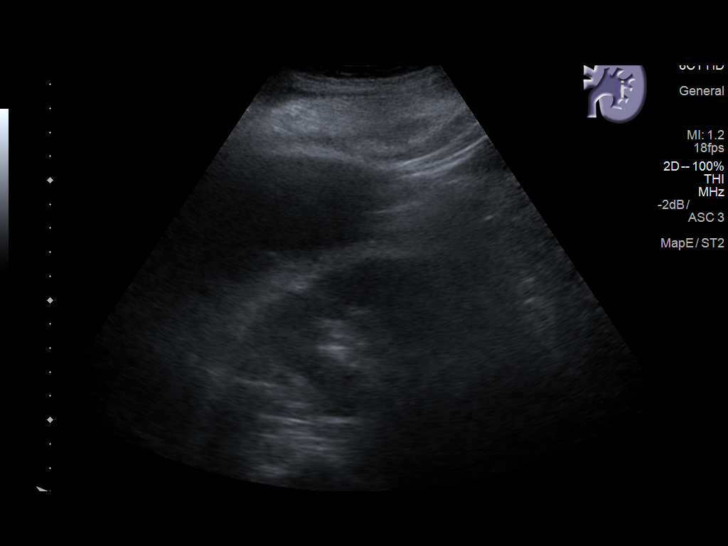
[im 22/33]
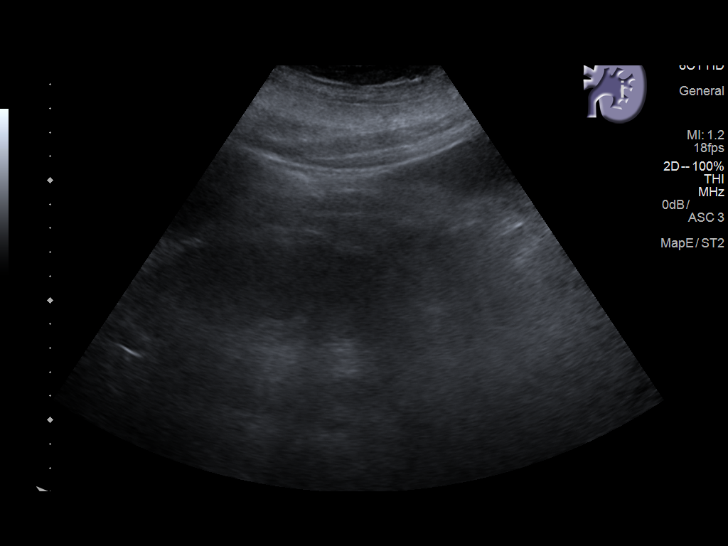
[im 25/33]
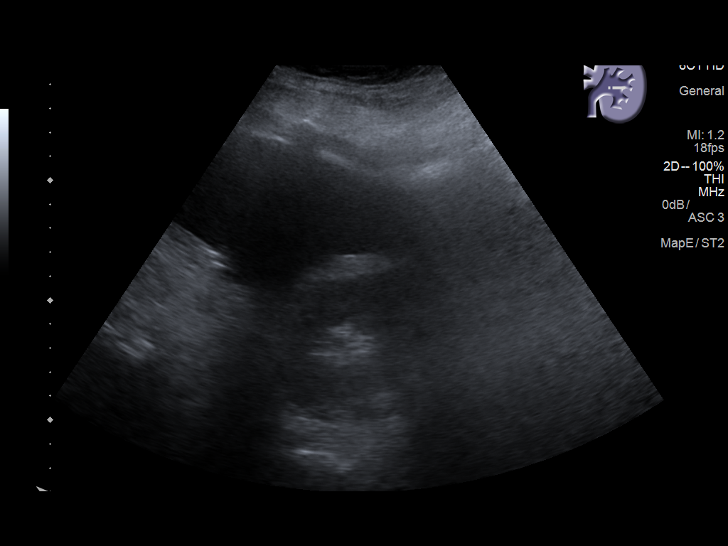
[im 27/33]
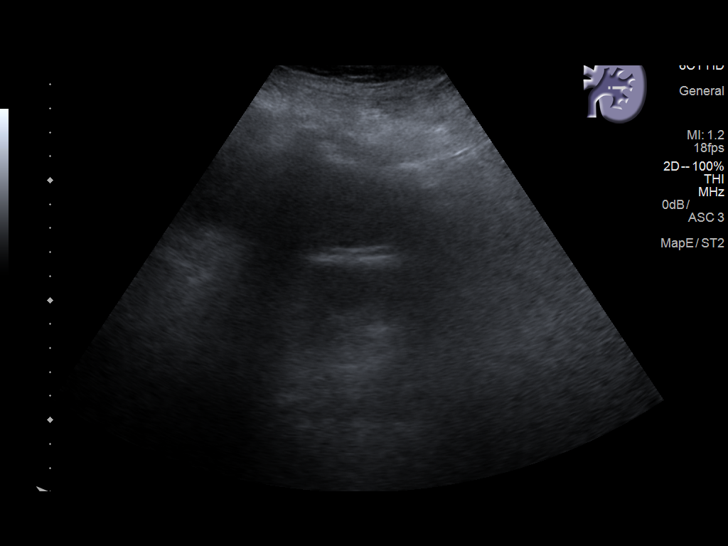
[im 30/33]
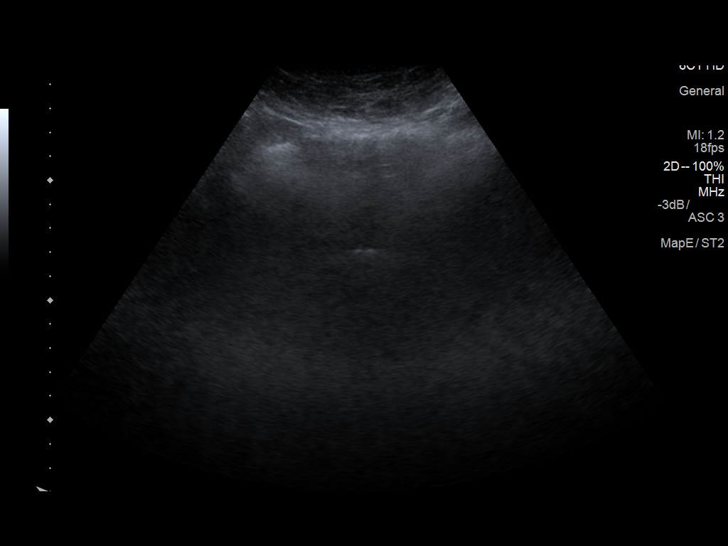
[im 33/33]
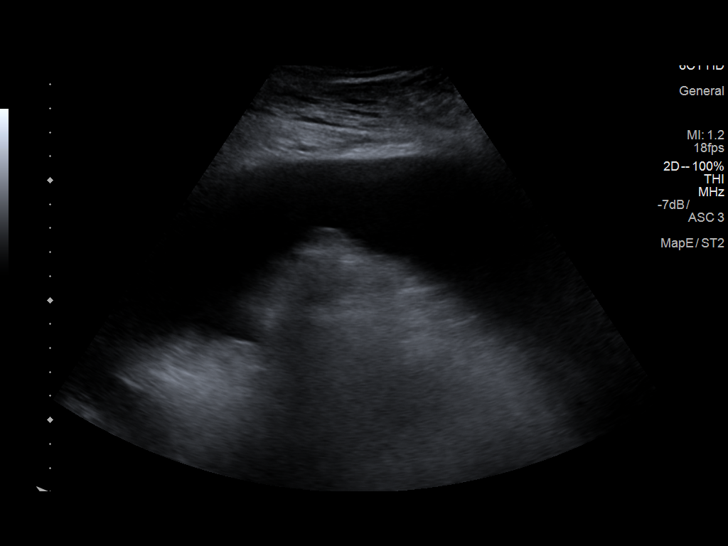

[14 of 25 positions shown; findings below may reference images not displayed]

FINDINGS: Right Kidney:

Length: 12.2 cm. Echogenicity within normal limits. No mass or
hydronephrosis visualized.

Left Kidney:

Length: 12.7 cm. Echogenicity within normal limits. No mass or
hydronephrosis visualized.

Bladder:

The bladder is decompressed with a Foley catheter.

Ascites is seen in the abdomen.
IMPRESSION: 1. Limited views of the kidneys due to patient body habitus. No
acute abnormality.
2. Ascites.
